# Patient Record
Sex: Male | Born: 1935 | Race: Black or African American | Hispanic: No | Marital: Married | Smoking: Former smoker
Health system: Southern US, Community
[De-identification: ages and names within clinical notes are randomized; demographics above are authoritative.]

## PROBLEM LIST (undated history)

## (undated) DIAGNOSIS — F0390 Unspecified dementia without behavioral disturbance: Secondary | ICD-10-CM

## (undated) DIAGNOSIS — G20A1 Parkinson's disease without dyskinesia, without mention of fluctuations: Secondary | ICD-10-CM

## (undated) DIAGNOSIS — G20 Parkinson's disease: Secondary | ICD-10-CM

## (undated) HISTORY — PX: JOINT REPLACEMENT: SHX530

---

## 2004-03-18 ENCOUNTER — Ambulatory Visit: Payer: Self-pay | Admitting: Unknown Physician Specialty

## 2006-06-20 ENCOUNTER — Ambulatory Visit: Payer: Self-pay | Admitting: General Practice

## 2006-07-02 ENCOUNTER — Other Ambulatory Visit: Payer: Self-pay

## 2006-07-02 ENCOUNTER — Emergency Department: Payer: Self-pay

## 2006-07-06 ENCOUNTER — Inpatient Hospital Stay: Payer: Self-pay | Admitting: General Practice

## 2006-07-11 ENCOUNTER — Encounter: Payer: Self-pay | Admitting: Internal Medicine

## 2006-08-04 ENCOUNTER — Encounter: Payer: Self-pay | Admitting: Internal Medicine

## 2008-10-10 ENCOUNTER — Ambulatory Visit: Payer: Self-pay | Admitting: Unknown Physician Specialty

## 2009-07-24 ENCOUNTER — Ambulatory Visit: Payer: Self-pay | Admitting: Urology

## 2009-07-26 ENCOUNTER — Ambulatory Visit: Payer: Self-pay | Admitting: Neurology

## 2009-11-03 ENCOUNTER — Ambulatory Visit: Payer: Self-pay | Admitting: Oncology

## 2009-11-19 ENCOUNTER — Ambulatory Visit: Payer: Self-pay | Admitting: Oncology

## 2009-12-04 ENCOUNTER — Ambulatory Visit: Payer: Self-pay | Admitting: Oncology

## 2010-01-03 ENCOUNTER — Ambulatory Visit: Payer: Self-pay | Admitting: Oncology

## 2011-02-20 ENCOUNTER — Inpatient Hospital Stay: Payer: Self-pay | Admitting: Internal Medicine

## 2011-02-21 DIAGNOSIS — I369 Nonrheumatic tricuspid valve disorder, unspecified: Secondary | ICD-10-CM

## 2011-02-24 ENCOUNTER — Encounter: Payer: Self-pay | Admitting: Internal Medicine

## 2011-03-06 ENCOUNTER — Encounter: Payer: Self-pay | Admitting: Internal Medicine

## 2011-07-06 ENCOUNTER — Observation Stay: Payer: Self-pay | Admitting: Internal Medicine

## 2011-07-06 LAB — CK TOTAL AND CKMB (NOT AT ARMC)
CK, Total: 204 U/L (ref 35–232)
CK-MB: 2.6 ng/mL (ref 0.5–3.6)

## 2011-07-06 LAB — CBC
HCT: 43.4 % (ref 40.0–52.0)
MCH: 29.8 pg (ref 26.0–34.0)
MCHC: 33.2 g/dL (ref 32.0–36.0)
Platelet: 121 10*3/uL — ABNORMAL LOW (ref 150–440)
RDW: 15.5 % — ABNORMAL HIGH (ref 11.5–14.5)
WBC: 4.5 10*3/uL (ref 3.8–10.6)

## 2011-07-06 LAB — TROPONIN I
Troponin-I: 0.02 ng/mL
Troponin-I: 0.04 ng/mL
Troponin-I: 0.04 ng/mL

## 2011-07-06 LAB — COMPREHENSIVE METABOLIC PANEL
Anion Gap: 9 (ref 7–16)
Calcium, Total: 8.9 mg/dL (ref 8.5–10.1)
Chloride: 107 mmol/L (ref 98–107)
Co2: 26 mmol/L (ref 21–32)
Creatinine: 0.73 mg/dL (ref 0.60–1.30)
Glucose: 92 mg/dL (ref 65–99)
SGOT(AST): 32 U/L (ref 15–37)
SGPT (ALT): 12 U/L

## 2011-07-06 LAB — CK-MB: CK-MB: 2.2 ng/mL (ref 0.5–3.6)

## 2011-07-07 LAB — CBC WITH DIFFERENTIAL/PLATELET
Basophil #: 0 10*3/uL (ref 0.0–0.1)
Basophil %: 0.4 %
Eosinophil #: 0.2 10*3/uL (ref 0.0–0.7)
Eosinophil %: 4.1 %
HCT: 38.2 % — ABNORMAL LOW (ref 40.0–52.0)
Lymphocyte #: 1.1 10*3/uL (ref 1.0–3.6)
Lymphocyte %: 23.1 %
MCH: 29.8 pg (ref 26.0–34.0)
MCV: 89 fL (ref 80–100)
Monocyte #: 0.5 10*3/uL (ref 0.0–0.7)
Monocyte %: 10.1 %
Neutrophil #: 3.1 10*3/uL (ref 1.4–6.5)
RDW: 15.2 % — ABNORMAL HIGH (ref 11.5–14.5)
WBC: 4.9 10*3/uL (ref 3.8–10.6)

## 2011-07-07 LAB — TROPONIN I: Troponin-I: 0.04 ng/mL

## 2011-07-07 LAB — CK-MB: CK-MB: 1.8 ng/mL (ref 0.5–3.6)

## 2011-07-07 LAB — BASIC METABOLIC PANEL
Anion Gap: 8 (ref 7–16)
Chloride: 106 mmol/L (ref 98–107)
Co2: 27 mmol/L (ref 21–32)
Creatinine: 0.85 mg/dL (ref 0.60–1.30)
EGFR (African American): >60
EGFR (Non-African Amer.): >60
Glucose: 90 mg/dL (ref 65–99)
Osmolality: 281 (ref 275–301)
Potassium: 3.7 mmol/L (ref 3.5–5.1)
Sodium: 141 mmol/L (ref 136–145)

## 2013-10-26 ENCOUNTER — Observation Stay: Payer: Self-pay | Admitting: Internal Medicine

## 2013-10-26 LAB — CBC WITH DIFFERENTIAL/PLATELET
BASOS ABS: 0.1 10*3/uL (ref 0.0–0.1)
Basophil %: 1.2 %
EOS ABS: 0.1 10*3/uL (ref 0.0–0.7)
Eosinophil %: 2.8 %
HCT: 42.4 % (ref 40.0–52.0)
HGB: 13.8 g/dL (ref 13.0–18.0)
Lymphocyte #: 1.4 10*3/uL (ref 1.0–3.6)
Lymphocyte %: 30 %
MCH: 29.1 pg (ref 26.0–34.0)
MCHC: 32.5 g/dL (ref 32.0–36.0)
MCV: 90 fL (ref 80–100)
Monocyte #: 0.5 x10 3/mm (ref 0.2–1.0)
Monocyte %: 11.2 %
NEUTROS ABS: 2.5 10*3/uL (ref 1.4–6.5)
Neutrophil %: 54.8 %
PLATELETS: 104 10*3/uL — AB (ref 150–440)
RBC: 4.74 10*6/uL (ref 4.40–5.90)
RDW: 15.4 % — ABNORMAL HIGH (ref 11.5–14.5)
WBC: 4.6 10*3/uL (ref 3.8–10.6)

## 2013-10-26 LAB — URINALYSIS, COMPLETE
BLOOD: NEGATIVE
Bilirubin,UR: NEGATIVE
Glucose,UR: NEGATIVE mg/dL (ref 0–75)
KETONE: NEGATIVE
LEUKOCYTE ESTERASE: NEGATIVE
Nitrite: NEGATIVE
PH: 6 (ref 4.5–8.0)
Protein: NEGATIVE
RBC,UR: <1 /HPF (ref 0–5)
Specific Gravity: 1.015 (ref 1.003–1.030)
Squamous Epithelial: <1

## 2013-10-26 LAB — COMPREHENSIVE METABOLIC PANEL
Albumin: 3.5 g/dL (ref 3.4–5.0)
Alkaline Phosphatase: 31 U/L — ABNORMAL LOW
Anion Gap: 11 (ref 7–16)
BUN: 18 mg/dL (ref 7–18)
Bilirubin,Total: 0.8 mg/dL (ref 0.2–1.0)
CHLORIDE: 108 mmol/L — AB (ref 98–107)
Calcium, Total: 9 mg/dL (ref 8.5–10.1)
Co2: 26 mmol/L (ref 21–32)
Creatinine: 1.21 mg/dL (ref 0.60–1.30)
EGFR (African American): >60
EGFR (Non-African Amer.): 56 — ABNORMAL LOW
GLUCOSE: 87 mg/dL (ref 65–99)
Osmolality: 290 (ref 275–301)
Potassium: 3.7 mmol/L (ref 3.5–5.1)
SGOT(AST): 17 U/L (ref 15–37)
SGPT (ALT): 16 U/L
SODIUM: 145 mmol/L (ref 136–145)
TOTAL PROTEIN: 6.9 g/dL (ref 6.4–8.2)

## 2013-10-26 LAB — TSH: Thyroid Stimulating Horm: 1.68 u[IU]/mL

## 2013-10-26 LAB — TROPONIN I
TROPONIN-I: 0.03 ng/mL
TROPONIN-I: 0.04 ng/mL

## 2013-10-27 LAB — BASIC METABOLIC PANEL
Anion Gap: 8 (ref 7–16)
BUN: 17 mg/dL (ref 7–18)
CALCIUM: 8.5 mg/dL (ref 8.5–10.1)
Chloride: 109 mmol/L — ABNORMAL HIGH (ref 98–107)
Co2: 26 mmol/L (ref 21–32)
Creatinine: 1.17 mg/dL (ref 0.60–1.30)
GFR CALC NON AF AMER: 59 — AB
Glucose: 72 mg/dL (ref 65–99)
OSMOLALITY: 285 (ref 275–301)
Potassium: 3.4 mmol/L — ABNORMAL LOW (ref 3.5–5.1)
Sodium: 143 mmol/L (ref 136–145)

## 2013-10-27 LAB — CBC WITH DIFFERENTIAL/PLATELET
BASOS ABS: 0.1 10*3/uL (ref 0.0–0.1)
Basophil %: 1.1 %
EOS PCT: 3.2 %
Eosinophil #: 0.2 10*3/uL (ref 0.0–0.7)
HCT: 40.1 % (ref 40.0–52.0)
HGB: 13.3 g/dL (ref 13.0–18.0)
LYMPHS PCT: 27.7 %
Lymphocyte #: 1.5 10*3/uL (ref 1.0–3.6)
MCH: 29.5 pg (ref 26.0–34.0)
MCHC: 33.1 g/dL (ref 32.0–36.0)
MCV: 89 fL (ref 80–100)
Monocyte #: 0.6 x10 3/mm (ref 0.2–1.0)
Monocyte %: 11.4 %
Neutrophil #: 3 10*3/uL (ref 1.4–6.5)
Neutrophil %: 56.6 %
PLATELETS: 90 10*3/uL — AB (ref 150–440)
RBC: 4.48 10*6/uL (ref 4.40–5.90)
RDW: 16 % — ABNORMAL HIGH (ref 11.5–14.5)
WBC: 5.3 10*3/uL (ref 3.8–10.6)

## 2013-10-27 LAB — CK-MB
CK-MB: 2.8 ng/mL (ref 0.5–3.6)
CK-MB: 2.3 ng/mL (ref 0.5–3.6)
CK-MB: 2.5 ng/mL (ref 0.5–3.6)

## 2013-10-27 LAB — TROPONIN I: TROPONIN-I: 0.04 ng/mL

## 2013-10-28 LAB — BASIC METABOLIC PANEL
ANION GAP: 8 (ref 7–16)
BUN: 12 mg/dL (ref 7–18)
Calcium, Total: 8.1 mg/dL — ABNORMAL LOW (ref 8.5–10.1)
Chloride: 109 mmol/L — ABNORMAL HIGH (ref 98–107)
Co2: 24 mmol/L (ref 21–32)
Creatinine: 1.04 mg/dL (ref 0.60–1.30)
EGFR (African American): >60
EGFR (Non-African Amer.): >60
Glucose: 98 mg/dL (ref 65–99)
OSMOLALITY: 281 (ref 275–301)
Potassium: 3.6 mmol/L (ref 3.5–5.1)
Sodium: 141 mmol/L (ref 136–145)

## 2013-10-28 LAB — URINE CULTURE

## 2013-10-31 LAB — CULTURE, BLOOD (SINGLE)

## 2013-11-01 LAB — CULTURE, BLOOD (SINGLE)

## 2014-07-02 LAB — URINALYSIS, COMPLETE
BACTERIA: NONE SEEN
BLOOD: NEGATIVE
Bilirubin,UR: NEGATIVE
GLUCOSE, UR: NEGATIVE mg/dL (ref 0–75)
Hyaline Cast: 16
Ketone: NEGATIVE
LEUKOCYTE ESTERASE: NEGATIVE
NITRITE: NEGATIVE
PROTEIN: NEGATIVE
Ph: 6 (ref 4.5–8.0)
RBC,UR: NONE SEEN /HPF (ref 0–5)
Specific Gravity: 1.01 (ref 1.003–1.030)
Squamous Epithelial: NONE SEEN
WBC UR: NONE SEEN /HPF (ref 0–5)

## 2014-07-02 LAB — BASIC METABOLIC PANEL
Anion Gap: 6 — ABNORMAL LOW (ref 7–16)
BUN: 18 mg/dL
CREATININE: 1.22 mg/dL
Calcium, Total: 9 mg/dL
Chloride: 106 mmol/L
Co2: 29 mmol/L
GFR CALC NON AF AMER: 56 — AB
Glucose: 130 mg/dL — ABNORMAL HIGH
POTASSIUM: 4 mmol/L
Sodium: 141 mmol/L

## 2014-07-02 LAB — CBC WITH DIFFERENTIAL/PLATELET
BASOS ABS: 0 10*3/uL (ref 0.0–0.1)
Basophil %: 0.7 %
EOS ABS: 0.2 10*3/uL (ref 0.0–0.7)
Eosinophil %: 4.4 %
HCT: 42 % (ref 40.0–52.0)
HGB: 13.8 g/dL (ref 13.0–18.0)
Lymphocyte #: 1.3 10*3/uL (ref 1.0–3.6)
Lymphocyte %: 34.2 %
MCH: 29.5 pg (ref 26.0–34.0)
MCHC: 32.8 g/dL (ref 32.0–36.0)
MCV: 90 fL (ref 80–100)
MONOS PCT: 10.2 %
Monocyte #: 0.4 x10 3/mm (ref 0.2–1.0)
NEUTROS PCT: 50.5 %
Neutrophil #: 1.9 10*3/uL (ref 1.4–6.5)
PLATELETS: 103 10*3/uL — AB (ref 150–440)
RBC: 4.66 10*6/uL (ref 4.40–5.90)
RDW: 16.3 % — AB (ref 11.5–14.5)
WBC: 3.7 10*3/uL — AB (ref 3.8–10.6)

## 2014-07-02 LAB — TROPONIN I

## 2014-07-03 LAB — CBC WITH DIFFERENTIAL/PLATELET
Basophil #: 0 10*3/uL (ref 0.0–0.1)
Basophil %: 0.8 %
EOS PCT: 4.8 %
Eosinophil #: 0.2 10*3/uL (ref 0.0–0.7)
HCT: 39.1 % — ABNORMAL LOW (ref 40.0–52.0)
HGB: 12.8 g/dL — ABNORMAL LOW (ref 13.0–18.0)
LYMPHS ABS: 1.2 10*3/uL (ref 1.0–3.6)
Lymphocyte %: 32 %
MCH: 29.2 pg (ref 26.0–34.0)
MCHC: 32.7 g/dL (ref 32.0–36.0)
MCV: 89 fL (ref 80–100)
MONO ABS: 0.5 x10 3/mm (ref 0.2–1.0)
Monocyte %: 12.6 %
Neutrophil #: 1.9 10*3/uL (ref 1.4–6.5)
Neutrophil %: 49.8 %
PLATELETS: 90 10*3/uL — AB (ref 150–440)
RBC: 4.37 10*6/uL — AB (ref 4.40–5.90)
RDW: 16.3 % — AB (ref 11.5–14.5)
WBC: 3.8 10*3/uL (ref 3.8–10.6)

## 2014-07-03 LAB — BASIC METABOLIC PANEL
Anion Gap: 5 — ABNORMAL LOW (ref 7–16)
BUN: 17 mg/dL
CALCIUM: 8.7 mg/dL — AB
Chloride: 110 mmol/L
Co2: 27 mmol/L
Creatinine: 1.07 mg/dL
EGFR (African American): >60
Glucose: 106 mg/dL — ABNORMAL HIGH
POTASSIUM: 3.9 mmol/L
SODIUM: 142 mmol/L

## 2014-07-03 LAB — TROPONIN I: Troponin-I: <0.03 ng/mL

## 2014-07-04 ENCOUNTER — Inpatient Hospital Stay
Admit: 2014-07-04 | Disposition: A | Discharge status: Skilled Nursing Facility | Payer: Self-pay | Attending: Internal Medicine | Admitting: Internal Medicine

## 2014-07-04 ENCOUNTER — Ambulatory Visit: Admit: 2014-07-04 | Disposition: A | Discharge status: Home / Self Care | Payer: Self-pay | Admitting: Neurology

## 2014-07-06 LAB — CLOSTRIDIUM DIFFICILE(ARMC)

## 2014-07-07 LAB — CREATININE, SERUM
CREATININE: 0.9 mg/dL
EGFR (African American): >60
EGFR (Non-African Amer.): >60

## 2014-07-08 DIAGNOSIS — R55 Syncope and collapse: Secondary | ICD-10-CM | POA: Diagnosis not present

## 2014-07-08 DIAGNOSIS — I2510 Atherosclerotic heart disease of native coronary artery without angina pectoris: Secondary | ICD-10-CM

## 2014-07-08 DIAGNOSIS — N400 Enlarged prostate without lower urinary tract symptoms: Secondary | ICD-10-CM

## 2014-07-08 DIAGNOSIS — E785 Hyperlipidemia, unspecified: Secondary | ICD-10-CM

## 2014-07-08 DIAGNOSIS — G3183 Dementia with Lewy bodies: Secondary | ICD-10-CM | POA: Diagnosis not present

## 2014-07-08 DIAGNOSIS — G20 Parkinson's disease: Secondary | ICD-10-CM

## 2014-07-23 DIAGNOSIS — R441 Visual hallucinations: Secondary | ICD-10-CM | POA: Diagnosis not present

## 2014-07-27 NOTE — H&P (Signed)
PATIENT NAME:  Levi Jordan, [name]:  [id number] DATE OF BIRTH:  11-Dec-1931  DATE OF ADMISSION:  10/26/2013  PRIMARY CARE PHYSICIAN:   REFERRING PHYSICIAN: Dr. Dorothea GlassmanPaul Malinda.   CHIEF COMPLAINT: Generalized weakness.   HISTORY OF PRESENT ILLNESS: Mr. Levi Jordan is an 81 year old with a history of hypertension, hyperlipidemia, Parkinson disease, per family who is well functional at baseline. The patient usually wakes up in the morning by 6:30, fixes his breakfast, walks around the house. For the last 3 days, the patient has been experiencing severe generalized weakness, sleeping most of the day with a decreased p.o. intake. Family states the patient slept most of the day the last 2 days. Today, when the patient's grandson tried to make him stand up in the morning, the patient fell backwards. This is a significant change from the baseline. Denies having any cough. Denies having any nausea, vomiting or diarrhea. Denies having any dysuria. Family did not notice having any fever. Denies having any sick contacts. Workup in the Emergency Department, no obvious source of any infection is noted. The patient is afebrile, has a normal white blood cell count. Cardiac enzymes are well within normal limits. Family stated that the patient has been having frequent urination; however, the patient's urinalysis is negative for any infection. The patient underwent chest x-ray, within normal limits. CT head without contrast, no acute abnormalities were noted. CT abdomen and pelvis showed large inguinal hernia containing proximal part of the sigmoid colon; otherwise, no signs of any obstruction. The patient also has a bilateral hydrocele, varicocele. The patient is unable to provide much history. The history is mainly obtained from the family members.   PAST MEDICAL HISTORY: 1.  Hypertension.  2.  Chronic kidney disease, stage II. 3.  Hyperuricemia.   4.  History of herpes simplex, type 2.  5.  H. pylori gastritis.  6.   Obesity.   7.  Glaucoma.  8.  Parkinson disease.  9.  Sigmoid diverticulosis.  10.  Obesity.   PAST SURGICAL HISTORY: 1.  Right inguinal hernia repair in teenage years. 2.  Colon polypectomy.  3.  Right total knee arthroplasty.  4.  Aspiration of the hydrocele.   ALLERGIES: No known drug allergies.   HOME MEDICATIONS: 1.  Travatan 1 drop to affected eye.  2.  Sinemet 1 tablet 3 times a day.  3.  Rapaflo 8 mg once a day.  4.  Pramipexole 0.25 mg 2 tablets 3 times a day.  5.  Toprol-XL 1 tablet once a day.  6.  Lisinopril 10 mg once a day.  7.  Aspirin 81 mg daily.   SOCIAL HISTORY: Remote history of heavy smoking, quit 20 years back. Drank alcohol remotely. Denies using any illicit drugs. Retired. Lives with his family members.   FAMILY HISTORY: Father died at the age of 79 from stroke. Had history of high blood pressure. Brother died from colon cancer.   REVIEW OF SYSTEMS:  GENERAL:  Positive for generalized weakness.  EYES: Denies having any change in vision.  ENT: No change in hearing.  CARDIOVASCULAR:  No chest pain, palpations.  RESPIRATORY: No cough, shortness of breath.  GASTROINTESTINAL: No nausea, vomiting, abdominal pain.  GENITOURINARY:  No  frequent urination.  HEMATOLOGIC: No easy bruising or bleeding.  SKIN: No rash or lesions.  MUSCULOSKELETAL:  Has history of arthritis. NEUROLOGIC:  The patient has a history of dementia and Parkinson disease.   PHYSICAL EXAMINATION: GENERAL: This is a well-built, well-nourished, age-appropriate male lying down in  the bed, not in distress.  VITAL SIGNS: Temperature 97.5, pulse 57, blood pressure 143/70, respiratory rate of 14. Oxygen saturation is 98% on room air.  HEENT: Head normocephalic, atraumatic. There is no scleral icterus. Conjunctivae normal. Pupils equal and reactive. Mucous membranes with mild dryness. No pharyngeal erythema.  NECK: Supple. No lymphadenopathy. No JVD. No carotid bruit.  CHEST: Has no focal  tenderness.  LUNGS: Bilaterally clear to auscultation.  HEART: S1, S2 regular. No murmurs are heard.  ABDOMEN: Bowel sounds present. Soft, nontender, nondistended. No hepatosplenomegaly.  EXTREMITIES: No pedal edema. Pulses 2+.  SKIN: No rash or lesions.  MUSCULOSKELETAL: Good range of motion in all the extremities.  NEUROLOGIC: The patient is alert, oriented to place, person. Could not tell the time. No apparent cranial nerve abnormalities. Motor 5/5 in upper and lower extremities.   LABORATORY DATA: CBC and CMP are completely within normal limits. TSH 1.68. Chest x-ray, PA and lateral, no acute cardiopulmonary disease. UA negative for nitrites and leukocyte esterase. CT HEAD WITHOUT CONTRAST: No acute intracranial abnormalities.  CT ABDOMEN AND PELVIS:  Large inguinal hernia containing proximal sigmoid colon. No obstruction. Bilateral hydroceles.   ULTRASOUND OF THE TESTICLES:  No evidence of testicular torsion, bilateral hydroceles noted.  Troponin 0.04.   ASSESSMENT AND PLAN: Mr. Levi Jordan, an 81 year old, comes to the Emergency Department with severe generalized weakness of acute onset.  1.  Generalized weakness. No obvious signs of any infection is noted. We will hold all sedating medications as the patient has been sleeping most of the day. Continue with IV fluids. We will involve physical therapy. We will also continue to follow up if patient has any signs of underlying infection, if the patient is developing any possibly viral infection. The patient's initial set of cardiac enzymes are negative; however, we will also check 2 other times, which seems to be unlikely.  2.  Hypertension, currently well-controlled. Continue with the home medications.  3.  Parkinson disease. Continue with Sinemet.  4.  Debility. We will involve physical therapy, occupational therapy.  5.  Keep the patient on deep vein thrombosis prophylaxis with Lovenox.   TIME SPENT: 50 minutes.      ____________________________ Susa Griffins, MD pv:dmm D: 10/26/2013 21:30:00 ET T: 10/26/2013 22:00:03 ET JOB#: [id number]  cc: Susa Griffins, MD, <Dictator>   Clerance Lav Berkley Cronkright MD ELECTRONICALLY SIGNED 11/08/2013 21:03

## 2014-07-27 NOTE — Discharge Summary (Signed)
PATIENT NAME:  Levi Jordan, Levi Jordan MR#:  [id number] DATE OF BIRTH:  17-Jan-1932  DATE OF ADMISSION:  10/26/2013 DATE OF DISCHARGE:  10/28/2013  ADMISSION DIAGNOSES:   1. Weakness. 2. Parkinson disease.  DISCHARGE DIAGNOSES:  1. Bacteremia with coag-negative staph.  2. Generalized weakness.  3. History of Parkinson disease.  4. Hypertension.   CONSULTATIONS: None.   LABORATORY DATA: Sodium 141, potassium 3.6, chloride 109, bicarbonate 24, BUN 12, creatinine 1.04, glucose is 98.   BLOOD CULTURES: Initial blood cultures showed coag-negative staph. Repeat blood cultures were negative growth.   HOSPITAL COURSE: This is an 81 year old male with a history of Parkinson disease, who presented on July 24th with generalized weakness. For further details, please refer to the H and P.  1. Bacteremia. The patient presented with generalized weakness, found to have positive blood cultures initially on admission. These actually seemed to be a contaminate. One out of 2 blood cultures were growing out coag-negative staphylococcus. Before antibiotics were started I repeated blood cultures which were negative to date. I did call the laboratory to confirm coag-negative staphylococcus. He was empirically placed on vancomycin and his symptoms stopped. 2. General weakness. PT recommended skilled nursing facility. However, the patient wants to go home with home health care. He has 24-hour supervision with family.  3. History of Parkinson disease. The patient is to continue on outpatient medications.  4. Hypertension. The patient was continued on metoprolol.   DISCHARGE MEDICATIONS:  1. Aspirin 81 mg daily.  2. Metoprolol 25 mg daily.  3. Rapaflo 8 mg daily.  4. Pramipexole 0.5 mg t.i.d.  5. CAridopa/levodopa 50/200 half a tablet t.i.d.  6. Travatan 0.04% ophthalmic solution 2 drops at bedtime.  7. Torsemide 5 mg daily.   Discharge with home health, physical therapy with nurse.   DISCHARGE DIET: Low sodium.    DISCHARGE DIETARY SUPPLEMENT: Ensure daily.  DISCHARGE FOLLOWUP: The patient will follow up with Dr. Carlus PavlovKristen Moffitt in 1 week.   The patient was stable for discharge. The patient will be discharged with home health.   TIME SPENT: 40 minutes.     ____________________________ Janyth ContesSital P. Juliene PinaMody, MD spm:lt D: 10/28/2013 12:14:26 ET T: 10/28/2013 22:08:01 ET JOB#: [id number]  cc: Jovahn Breit P. Juliene PinaMody, MD, <Dictator> Gwendalyn EgeKristen S. Suzie PortelaMoffitt, MD Janyth ContesSITAL P Shatera Rennert MD ELECTRONICALLY SIGNED 10/29/2013 13:03

## 2014-07-28 NOTE — Consult Note (Signed)
Brief Consult Note: Diagnosis: CP, neg troponin, nondiagnostic ECG.   Patient was seen by consultant.   Consult note dictated.   Comments: REC  Agree wtih current therapy, defer full dose anticoagulation, proceed with Lexiscan-sest in am.  Electronic Signatures: Marcina MillardParaschos, Jillann Charette (MD)  (Signed 02-Apr-13 19:24)  Authored: Brief Consult Note   Last Updated: 02-Apr-13 19:24 by Marcina MillardParaschos, Lesley Galentine (MD)

## 2014-07-28 NOTE — Discharge Summary (Signed)
PATIENT NAME:  Levi Jordan, Levi Jordan MR#:  [id number] DATE OF BIRTH:  [birth year]  DATE OF ADMISSION:  07/06/2011 DATE OF DISCHARGE:  07/07/2011  PRIMARY CARE PHYSICIAN: Einar CrowMarshall Anderson, MD   DISCHARGE DIAGNOSES:  1. Chest pain, likely noncardiac, with negative serial cardiac enzymes and negative Myoview. Could be muscular versus gastroesophageal reflux disease.  2. Sweating on and off for the last three months of unknown etiology. Recommend outpatient follow-up with his primary care physician.  3. Mild lower extremity swelling. No congestive heart failure. He does not have any dyspnea on exertion.  4. Malignant hypertension, now resolved. Could be due to pain. Advanced blood pressure medication.   SECONDARY DIAGNOSES:  1. History of bladder outlet obstruction.  2. Hypertension.  3. Chronic kidney disease, stage II.  4. Hyperuricemia.  5. Herpes simplex virus, type 2.  6. Helicobacter pylori gastritis.  7. Obesity.  8. Hypertension.  9. Glaucoma.  10. History of colon cancer.  11. Cecal adenoma.  12. Sigmoid diverticulosis.  13. Parkinson's disease.   CONSULTATIONS:  Cardiology, Dr. Darrold JunkerParaschos.   LABORATORY, DIAGNOSTIC AND RADIOLOGICAL DATA:   Myoview on April 3rd was negative.   Chest x-ray on April 2nd showed no acute cardiopulmonary disease.  Bilateral lower extremity Dopplers on April 2nd were negative for any deep vein thrombosis.   HISTORY AND SHORT HOSPITAL COURSE: The patient is a 81 year old male with the above-mentioned medical problems who was admitted for chest pain. Please see Dr. Arlys JohnVaickute's dictated History and Physical for further details. Cardiology consult was obtained with Dr. Darrold JunkerParaschos, who recommended Myoview, which was obtained and was negative. He had negative four sets of cardiac enzymes and was ruled out for myocardial infarction. After a discussion with Cardiology, the patient was discharged home as his chest pain was thought to be noncardiac, possible etiologies  were muscular versus possible gastroesophageal reflux disease. The patient also had intermittent on and off sweating for the last three months for which he may need outpatient work-up with his primary care physician. The patient's blood pressure on admission was elevated, which was thought to be secondary to pain and was slowly improving with better blood pressure medication control.   PERTINENT DISCHARGE PHYSICAL EXAMINATION:  VITAL SIGNS: On the date of discharge, his temperature was 97.7, heart rate 68 per minute, respirations 18 per minute, blood pressure 143/75 mmHg. He was saturating 98% on room air. CARDIOVASCULAR: S1, S2 normal. No murmurs, rubs, or gallop. LUNGS: Clear to auscultation bilaterally. No wheezing, rales, rhonchi, or crepitation. ABDOMEN: Soft, benign. NEUROLOGICAL: Nonfocal examination. All other physical examination remained at the baseline.   DISCHARGE MEDICATIONS:  1. Aspirin 81 mg p.o. daily.  2. Sinemet 25/100 mg 1 tablet p.o. t.i.d.  3. Betimol 0.5% ophthalmic eyedrops b.i.d.  4. Travatan Z 0.004%, 1 drop to each eye at bedtime.  5. Pramipexole 0.25 mg, 2 tablets p.o. t.i.d.  6. Donepezil 10 mg p.o. at bedtime.  7. Rapaflo 8 mg p.o. daily.  8. Metoprolol 50 mg p.o. daily.  9. Lisinopril 10 mg p.o. daily.   DISCHARGE DIET: Low sodium.   DISCHARGE ACTIVITY: As tolerated.     DISCHARGE INSTRUCTIONS AND FOLLOWUP:    1. The patient was instructed to follow up with his new primary care physician, Dr. Einar CrowMarshall Anderson, on April 17th as scheduled.  2. He will need followup with Dr. Cristopher PeruHemang Tyna [facility] on April 17th as scheduled.  3. [facility] in the next 2 to 3  weeks.  4. He will require followup with his outpatient urologist as scheduled on April 15th.   TOTAL TIME DISCHARGING THIS PATIENT: 45 minutes.  ____________________________ Ellamae Sia. Sherryll Burger, MD vss:cbb D: 07/07/2011 22:35:12  ET T: 07/08/2011 11:29:42 ET JOB#: [id number]  cc: Papa Piercefield S. Sherryll Burger, MD, <Dictator> Marya Amsler. Dareen Piano, MD Durward Mallard Marguerite Olea, MD Hemang K. Sherryll Burger, MD Marcina Millard, MD Ellamae [facility] MD ELECTRONICALLY SIGNED 07/08/2011 21:52

## 2014-07-28 NOTE — H&P (Signed)
PATIENT NAME:  Levi Jordan, Levi Jordan MR#:  [id number] DATE OF BIRTH:  1931/07/08  DATE OF ADMISSION:  07/06/2011  PRIMARY CARE PHYSICIAN: Wonda Cheng, MD  HISTORY OF PRESENT ILLNESS: The patient is a 81 year old African American male with past medical history significant for history of hypertension and coronary artery disease who presented to the hospital with complaints of chest pressure. According to the patient, he was doing well up until today, on the day of admission, when he suddenly became very sweaty and started having midsternal chest tightness. Pain was described as intermittent, 10/10 by intensity, with no radiation or discomfort, but accompanied significant sweating. In the past, he was having some sweating over the past 3 or 4 years. It would come intermittently, every 3 or 4 months, but it was not accompanied by chest tightness. Today he had chest tightness as well. The patient decided to come to the emergency room for further evaluation. In the emergency room, he was noted to have an abnormal EKG and hospitalist services were contacted for admission.   PAST MEDICAL HISTORY:  1. History of bladder outlet obstruction.  2. History of hypertension.  3. Chronic kidney disease, stage II. 4. Hyperuricemia. 5. History of herpes simplex virus, type II. 6. H. pylori gastritis. 7. Obesity. 8. Hypertension. 9. Glaucoma. 10. Family history of colon cancer in brother. 11. Cecal adenoma. 12. Sigmoid diverticulosis. 13. Parkinson's disease.   PAST SURGICAL HISTORY:  1. Right inguinal hernia repair, in teenage years. 2. History of colon polypectomy in 2000. 3. History of right total knee arthroplasty in 2008. 4. Aspiration of right hydrocele in 2010.   MEDICATIONS:  1. Travatan Z drops 0.004% one drop to both eyes daily at bedtime.  2. Betimol drops 0.5% one drop twice daily.  3. Amlodipine 10 mg daily.  4. Aspirin 81 mg p.o. daily.  5. Lopressor, it is unclear how much he is taking, but  according to prior discharge summary he was discharged on 50 mg daily; however, according to Dr. Marguerite Olea, he is still taking 25 mg p.o. daily.  6. Sinemet 25/100 mg three times daily. 7. Pramipexole 0.5 mg three times daily.  8. Aricept 5 mg p.o. daily.  9. Vitamin B12 1000 mcg p.o. daily. 10. He is also taking some medication for bladder outlet obstruction. He does not remember what. 11. According to Dr. Margaretmary Eddy dictation in January 2013, the patient was also on lisinopril 20 mg p.o. daily. It is unclear if he is still taking lisinopril.  ALLERGIES: No known drug allergies.   FAMILY HISTORY: The patient's father died at the age of 53 of stroke, as well as high blood pressure. The patient's mother's history is relatively benign. The patient's brother is deceased with colon cancer.   SOCIAL HISTORY: The patient used to smoke two to three packs a day until age of 63, which would be somewhere around 20 years. He used to drink but states that he stopped drinking at the age of 79. He does not take any drugs. He is retired.   REVIEW OF SYSTEMS: Positive for weakness with sweating episodes, pain in his chest, blurring of vision, cataract removal bilaterally as well as glaucoma for which he uses reading glasses, sinus congestion intermittently, ankle edema, chest pressure, and increased urination for he has been taking medications given by Dr. Evelene Croon from urology. He does not remember what kind. CONSTITUTIONAL: Otherwise, he denies any fevers, chills, fatigue, weight loss or gain.  EYES: In regards to eyes, denies any double vision or  inflammation. ENT: Denies any tinnitus, allergies, epistaxis, sinus pain, dentures, or difficulty swallowing. RESPIRATORY: Denies any cough, wheezing, asthma, or COPD. CARDIOVASCULAR: Denies orthopnea, arrhythmias, palpitations, or syncope. GASTROINTESTINAL: Denies any nausea, vomiting, diarrhea, or constipation. GENITOURINARY: Denies dysuria or incontinence. ENDOCRINOLOGY: Denies  any polydipsia, nocturia, thyroid problems, heat or cold intolerance, or thirst. HEMATOLOGIC: Denies anemia, easy bruising, bleeding, or swollen glands. SKIN: Denies any acne, rashes, lesions, or change in moles. MUSCULOSKELETAL: Denies arthritis, cramps, swelling, or gout. NEUROLOGIC: Denies numbness, epilepsy, or tremor. PSYCH: Denies anxiety, insomnia, or depression.   PHYSICAL EXAMINATION:   VITAL SIGNS: On arrival to the hospital, the patient's temperature was 97.4, pulse 55, respiration rate 18, blood pressure 189/90, and saturation 100% on room air.  GENERAL: This is a well-developed, well-nourished African American male in no significant distress sitting on the stretcher. He is somewhat stiff and mimic less with no significant facial mimicry.   HEENT: His pupils are equal and reactive to light. Extraocular movements are intact. No icterus or conjunctivitis. He has normal hearing. No pharyngeal erythema. Mucosa is moist.  NECK: No masses, supple and nontender. Thyroid is not enlarged. No adenopathy. No JVD or carotid bruits bilaterally. Full range of motion.   LUNGS: Clear to auscultation in all fields, although diminished breath sounds. No rales, rhonchi, wheezing, labored inspirations, increased effort, dullness to percussion, or overt respiratory distress.   HEART: S1 and S2 appreciated. No murmurs, gallops, or rubs are noted. Rhythm was regular, distant. PMI not lateralized. Chest is nontender to palpation.  EXTREMITIES:  1+ pedal pulses and 1 to 2+ lower extremity, as well as induration, especially on the left, less induration on the right.   ABDOMEN: Soft and nontender. Bowel sounds are present. No hepatosplenomegaly or masses were noted.   RECTAL: Deferred.   MUSCULOSKELETAL: Able to move all extremities. No cyanosis, degenerative joint disease, or kyphosis. Gait was not tested.   SKIN: No rashes, lesions, erythema, or nodularity. The patient does have induration of his right  lower extremity more than left lower extremity. Skin was warm and dry to palpation. Peripheral pulses are good at 2+.   LYMPH: No adenopathy in the cervical region.   NEUROLOGICAL: Cranial nerves grossly intact. Sensory is intact. The patient has some dysarthria because of his Parkinson's disease as well as some rolling pill movements in his hands and significant stiffness/rigidity. He is alert and oriented to time, person, and place. Cooperative. Memory is good.   PSYCHIATRIC: No significant confusion, agitation, or depression noted.   LABS/STUDIES: BMP was within normal limits. B-type natriuretic peptide was slightly elevated at 489. Liver enzymes were unremarkable. The patient's cardiac enzymes, first set, was normal. White blood cell count is normal at 4.5, hemoglobin 14.4, and platelets 121.   EKG showed normal sinus rhythm at 61 beats per minute. RSR prime pattern in V1 suggesting right ventricular conduction delay. Flattening as well as depressions in bilateral leads. No acute ST-T changes were noted. In comparison to other EKG done in November 2012, at that time the patient also had very similar EKG changes, so no change since November 2012.   ASSESSMENT AND PLAN:  1. Chest pain: Admit the patient to the medical floor. Continue him on beta blockers and aspirin. Add also nitroglycerin as well as Lovenox. Check cardiac enzymes x3. Get Myoview stress test in the morning.  2. Malignant hypertension: Continue the patient on Norvasc as well as metoprolol. Return back to lisinopril 20 mg p.o. daily dose. Advance as necessary.  3.  History of Parkinson disease: Continue outpatient medications.   4. History of glaucoma: Continue medications.  5. History of benign prostatic hypertrophy: Start the patient on Flomax. It is unclear if he is using Flomax or other medication for his benign prostatic hypertrophy. However, at least for now, we will be continuing this medication.        6. History of  B12 deficiency: Continue B12.   TIME SPENT: 50 minutes. ____________________________ Katharina Caper, MD rv:slb D: 07/06/2011 13:01:28 ET T: 07/06/2011 15:03:41 ET JOB#: [id number]  cc: Katharina Caper, MD, <Dictator> Durward Mallard. Marguerite Olea, MD Katharina Caper MD ELECTRONICALLY SIGNED 07/07/2011 14:26

## 2014-07-28 NOTE — Consult Note (Signed)
PATIENT NAME:  Levi Jordan, Levi Jordan MR#:  [id number] DATE OF BIRTH:  1932/02/18  DATE OF CONSULTATION:  07/06/2011  REFERRING PHYSICIAN:  Katharina Caperima Vaickute, MD CONSULTING PHYSICIAN:  Marcina MillardAlexander Allani Reber, MD  CHIEF COMPLAINT: Chest pain.   HISTORY OF PRESENT ILLNESS: The patient is a 81 year old gentleman referred for evaluation of chest pain. The patient has a remote history of coronary artery disease. He was apparently in his usual state of health until earlier today when he experienced substernal chest tightness. He presented to the [facility] Emergency Room where EKG was nondiagnostic. The patient was admitted for observation where he has ruled out for myocardial infarction by CPK isoenzymes and troponin.   PAST MEDICAL HISTORY:  1. The patient reports a prior history of coronary artery disease with myocardial infarction over 10 years ago.  2. Hypertension.  3. Parkinson's.  4. Chronic kidney disease.  5. History of bladder outlet obstruction.  6. Obesity.   MEDICATIONS:  1. Aspirin 81 mg daily.  2. Amlodipine 10 mg daily.  3. Lopressor 25 mg daily.  4. Betimol drops 0.5% twice a day. 5. Sinemet 25/100 mg three times daily.  6. Pramipexole 25 mg three times daily. 7. Aricept 5 mg daily.  8. Vitamin B12 1000 mcg daily.   SOCIAL HISTORY: The patient currently lives alone, but his daughters are frequent visitors. The patient quit tobacco abuse 20 years ago.   FAMILY HISTORY: No immediate family history of coronary artery disease or myocardial infarction.   REVIEW OF SYSTEMS: CONSTITUTIONAL: No fever or chills. EYES: No blurry vision. EARS: No hearing loss. RESPIRATORY: The patient denies shortness of breath. CARDIOVASCULAR: The patient does have chest pain as described above, which has resolved. GASTROINTESTINAL: The patient denies nausea, vomiting, diarrhea, or constipation. GU: The patient denies dysuria or hematuria. ENDOCRINE: The patient denies polyuria or polydipsia. MUSCULOSKELETAL: The  patient denies arthralgias or myalgias. NEUROLOGICAL: The patient denies focal muscle weakness or numbness. The patient does have Parkinson's.   PHYSICAL EXAMINATION:   VITAL SIGNS: Blood pressure 178/97, pulse 75, respirations 18, temperature 98.1, and pulse oximetry 98%.   HEENT: Pupils are equal and reactive to light and accommodation.   NECK: Supple without thyromegaly.   LUNGS: Clear.   HEART: Normal jugular venous pressure. Normal point of maximal impulse. Regular rate and rhythm. Normal S1 and S2. No appreciable gallop, murmur, or rub.   ABDOMEN: Soft and nontender. Pulses were intact bilaterally.   MUSCULOSKELETAL: Normal muscle tone.   NEUROLOGIC: The patient is alert and oriented. He had poor insight. The patient does have a resting tremor consistent with Parkinson's.   IMPRESSION: This is a 81 year old gentleman who presents with chest pain and has ruled out for myocardial infarction with negative cardiac biomarkers.      RECOMMENDATIONS:  1. Agree with current therapy.  2. Agree with Lexiscan sestamibi study in the a.m.  3. Further recommendations pending Lexiscan sestamibi results.  ____________________________ Marcina MillardAlexander Jewelene Mairena, MD ap:slb D: 07/06/2011 19:23:12 ET T: 07/07/2011 10:12:08 ET JOB#: [id number]  cc: Marcina MillardAlexander Quincee Gittens, MD, <Dictator> Marcina MillardALEXANDER Labria Wos MD ELECTRONICALLY SIGNED 07/21/2011 8:46

## 2014-08-04 NOTE — H&P (Signed)
PATIENT NAME:  Levi Jordan, Levi Jordan MR#:  [id number] DATE OF BIRTH:  [birth year]  DATE OF ADMISSION:  07/02/2014  REFERRING PHYSICIAN: Kathreen Devoid. Paduchowski, MD   PRIMARY CARE PHYSICIAN: Einar Crow, MD of [facility].   CHIEF COMPLAINT: Fall.   HISTORY OF PRESENT ILLNESS: An 81 year old African American gentleman with a history of dementia and Parkinson's, coronary artery disease, hypertension, essential, presenting after a syncopal episode. The patient is unable to provide meaningful information. History obtained from the family present at bedside. They describe repeated falls over the last 3 days' duration, particularly when going from sitting to standing position. He usually does use a walker his mobility has greatly worsened. No chest pain, further symptomatology.  Today he was attempting to go from sitting to standing, actually passed out, loss of consciousness.  No loss of bowel or bladder function. No postictal confusion. No tonic-clonic activity noted. Brought to the hospital for further workup and evaluation. In the ER noted to be markedly orthostatic.   REVIEW OF SYSTEMS: Unable to obtain given patient's mental status and medical condition.   PAST MEDICAL HISTORY: Dementia, Parkinson's, coronary artery disease, hypertension, essential.   SOCIAL HISTORY: No tobacco, alcohol, or drug usage. He uses a walker for ambulation. Baseline confused but able to communicate  FAMILY HISTORY: No known cardiovascular or pulmonary disorders.   ALLERGIES: No known drug allergies.   HOME MEDICATIONS: Aspirin 81 mg p.o. daily, Rapaflo 8 mg p.o. q. daily, carbidopa/levodopa 50/200 mg half tablet 3 times daily, pramipexole 0.5 mg 3 times daily, metoprolol succinate 25 mg daily, torsemide 5 mg daily, Travatan 0.004% ophthalmic solution 2 drops to each eye at bedtime.   PHYSICAL EXAMINATION:  VITAL SIGNS: Temperature 97.9, heart rate 79, respirations 16, blood pressure 102/52, saturating 100% on room  air. Orthostatic vital signs: Lying heart rate 57, blood pressure 134/66, sitting heart rate 66, blood pressure 101/53. Weight 81.6 kg, BMI of 30.  GENERAL: Chronically-ill, frail-appearing African American  gentleman currently in no acute distress.  HEAD: Normocephalic, atraumatic.  EYES: Pupils equal, round, react to light. Extraocular muscles intact. No scleral icterus.  MOUTH: Dry mucosal membrane. Dentition poor. No abscess noted.  EAR, NOSE, THROAT: Clear without exudates. No external lesions.  NECK: Supple. No thyromegaly. No nodules. No JVD.  PULMONARY: Clear to auscultation bilaterally without wheezes, rales, or rhonchi. No use of accessory muscles. Good respiratory effort.  CHEST: Nontender to palpation.  CARDIOVASCULAR: S1, S2, bradycardic. No murmurs, rubs, or gallops. No edema. Pedal pulses 2+ bilaterally. GASTROINTESTINAL: Soft, nontender, nondistended. No masses. Positive bowel sounds. No hepatosplenomegaly.  MUSCULOSKELETAL: No swelling, clubbing, edema. Range of motion full in all extremities.  NEUROLOGIC: Cranial nerves II through XII intact. No gross focal neurological deficits. Sensation intact. Reflexes intact.  SKIN: No ulceration, lesion, rash, or cyanosis. Skin warm and dry. Turgor intact.  PSYCHIATRIC: Mood, affect within normal limits. The patient is awake, alert, oriented to person. Insight and judgment poor.   LABORATORY DATA: EKG performedevidence of LVH. No ST, T abnormalities. Sodium 141, potassium 4, chloride 106, bicarbonate 29, BUN 18, creatinine 1.22, glucose 130, WBC 3.7, hemoglobin of 13. platelets are 103,000. Urinalysis noevidence of infection.   ASSESSMENT AND PLAN: An 81 year old African American  gentleman with a history of  dementia, Parkinson's, coronary artery disease presenting with syncopal episode.  1.  Orthostatic syncope. We will place on telemetry. Trend cardiac enzymes x 3 and provide IV fluid hydration, hold diuretics.  2.  Hypertension,  essential. Continue with metoprolol and hold further  diuretics.  4.  Parkinson's. Continue with pramipexole and Sinemet.   5.  Venous thromboembolic prophylaxis. Heparin subcutaneous.    CODE STATUS: The patient is full code.   TIME SPENT: 35 minutes.    ____________________________ Cletis Athensavid K. Carter Kaman, MD dkh:AT D: 07/02/2014 23:28:41 ET T: 07/02/2014 23:47:33 ET JOB#: [id number]  cc: Cletis Athensavid K. Darek Eifler, MD, <Dictator> Rosalynd Mcwright Synetta ShadowK Gillie Fleites MD ELECTRONICALLY SIGNED 07/04/2014 13:10

## 2014-08-04 NOTE — Consult Note (Signed)
PATIENT NAME:  Levi Jordan, Levi Jordan MR#:  [id number] DATE OF BIRTH:  1931/07/02  DATE OF CONSULTATION:  07/04/2014  CONSULTING PHYSICIAN:  Pauletta BrownsYuriy Jocelyne Reinertsen, MD  REASON FOR CONSULTATION: Syncope, history of Parkinson disease.   HISTORY OF PRESENT ILLNESS: An 81 year old gentleman with a past medical history of Parkinson disease on pramipexole and Sinemet presenting with a syncopal event. No seizure-like activity. No tongue biting. No urinary incontinence.   REVIEW OF SYSTEMS: Unable to obtain due to a history mental status.   PAST MEDICAL HISTORY: Dementia, Parkinson's, coronary artery disease, hypertension. No history of cardiovascular or pulmonary disease.   ALLERGIES: No drug allergies.   HOME MEDICATIONS: Have been reviewed.   NEUROLOGIC EXAMINATION: The patient is alert, awake, to his name. Could not tell me his location. No focal motor deficits, 4+/5 bilateral upper and lower extremities. Speech appears to be fluent but slow. Extraocular movements are intact. No sensory deficits found.   IMPRESSION: A gentleman with orthostatic syncope with history of Parkinson's disease status post MRI of the brain, no acute intracranial abnormality. Physical therapy, occupational therapy. Dopaminergic medication that patient takes for Parkinson disease can cause orthostatic hypotension. If that is an issue, can add midodrine or Florinef. No further imaging from a neurological standpoint.   Thank you; it was a pleasure seeing this patient.     ____________________________ Pauletta BrownsYuriy Diangelo Radel, MD yz:bm D: 07/04/2014 19:29:36 ET T: 07/05/2014 06:42:23 ET JOB#: [id number]  cc: Pauletta BrownsYuriy Piero Mustard, MD, <Dictator> Pauletta BrownsYURIY Kaegan Hettich MD ELECTRONICALLY SIGNED 07/09/2014 15:50

## 2014-08-04 NOTE — Discharge Summary (Signed)
PATIENT NAME:  Levi Jordan, [name] MR#:  [id number] DATE OF BIRTH:  04-Jan-1932  DATE OF ADMISSION:  07/04/2014 DATE OF DISCHARGE:  07/07/2014  DATE OF BIRTH: Marya AmslerMarshall W. Dareen Jordan, M.D.   FINAL DIAGNOSES:  1. Orthostatic hypotensive syncope with history of hypertension.  2. Parkinson disease.  3. Vitamin D deficiency.  4. Glaucoma, unspecified.  5. Hyperlipidemia, unspecified.   MEDICATIONS ON DISCHARGE: Include aspirin 81 mg daily, metoprolol ER 25 mg daily, pramipexole 0.5 mg 3 times a day, Sinemet 50/200 mg extended-release 1/2 tablet 3 times a day, Travatan Z 0.004% ophthalmic solution 2 drops affected eye at bedtime, simvastatin 40 mg at bedtime, lisinopril 10 mg daily, vitamin D 50,000 units 1 tablet weekly. Stop taking Rapaflo and torsemide.   DIET: Low sodium, low fat, low cholesterol diet.   ACTIVITY: As tolerated.   FOLLOW-UP: In 1 to 2 days with doctor at rehabilitation.   HOSPITAL COURSE: The patient was initially admitted on 07/02/2014 after a fall, found to be orthostatic. Was given IV fluid hydration and torsemide was held.   CONSULTANTS DURING THE HOSPITAL COURSE: Included physical therapy, occupational therapy, and Dr. Loretha BrasilZeylikman, neurology.   LABORATORY AND RADIOLOGICAL DATA DURING THE HOSPITAL COURSE: Included an EKG that showed normal sinus rhythm, LVH.   Troponin negative.   Glucose 130, BUN 18, creatinine 1.22, sodium 141, potassium 4.0, chloride 106, CO2 of 29, calcium 9.0. White blood cell count 3.7, hemoglobin and hematocrit 13.8 and 42.0, platelet count of 103,000.   Chest x-ray showed no active disease.   CT scan of the head showed no acute intracranial abnormality.   Urinalysis negative.   Vitamin D 25-hydroxy is low at 5.7.   Next 2 troponins were negative.   Stool for Clostridium difficile was negative.   Creatinine upon discharge 0.9. Hemoglobin upon discharge 12.8, platelet count of 90,000.   MRI of the brain without contrast showed no acute  intracranial abnormality, mild to moderate chronic small vessel ischemic changes.   Looking back at old platelet counts, platelet count was 90,000 in the past, so this is chronic.   HOSPITAL COURSE PER PROBLEM LIST:  1. For the patient's orthostatic syncope, the patient was given IV fluid hydration. His diuretic was held. Initially, blood pressure medications were held. Blood pressure did go up and has been very variable during the hospital course. Would like his blood pressure on the higher side since he is orthostatic with the Parkinson disease. I am okay with his blood pressure being in the 160s to 170s, because once he stands up, it can down pretty low. Low dose metoprolol and lisinopril given for blood pressure. When taking his blood pressure, be sure to take it when he is not actively having a tremor. Can get falsely elevated readings with that. Blood pressure very variable during the hospital course, highs and lows. Unable to titrate up blood pressure medications for the highs secondary to orthostatic hypotension.  2. Parkinson disease, kept on his usual medications.  3. Weakness. Physical therapy recommended rehabilitation.  4. Glaucoma. Continue Travatan.  5. Vitamin D deficiency. Continue vitamin D supplementation.  6. Hyperlipidemia, unspecified. Continue simvastatin.  TIME SPENT ON DISCHARGE: 35 minutes.    ____________________________ Herschell Dimesichard J. Renae GlossWieting, MD rjw:JT D: 07/07/2014 09:53:19 ET T: 07/07/2014 10:04:59 ET JOB#: [id number]  cc: Herschell Dimesichard J. Renae GlossWieting, MD, <Dictator> [name], MD [facility]  Salley ScarletICHARD J Lilianna Case MD ELECTRONICALLY SIGNED 07/11/2014 15:39

## 2015-02-03 ENCOUNTER — Emergency Department: Payer: Medicare Other

## 2015-02-03 ENCOUNTER — Encounter: Payer: Self-pay | Admitting: Emergency Medicine

## 2015-02-03 ENCOUNTER — Emergency Department
Admission: EM | Admit: 2015-02-03 | Discharge: 2015-02-03 | Disposition: A | Discharge status: Home / Self Care | Payer: Medicare Other | Attending: Student | Admitting: Student

## 2015-02-03 DIAGNOSIS — Z79899 Other long term (current) drug therapy: Secondary | ICD-10-CM | POA: Insufficient documentation

## 2015-02-03 DIAGNOSIS — G20 Parkinson's disease: Secondary | ICD-10-CM | POA: Insufficient documentation

## 2015-02-03 DIAGNOSIS — R531 Weakness: Secondary | ICD-10-CM | POA: Diagnosis present

## 2015-02-03 LAB — CBC
HCT: 39.4 % — ABNORMAL LOW (ref 40.0–52.0)
Hemoglobin: 13.1 g/dL (ref 13.0–18.0)
MCH: 29.6 pg (ref 26.0–34.0)
MCHC: 33.1 g/dL (ref 32.0–36.0)
MCV: 89.4 fL (ref 80.0–100.0)
Platelets: 90 10*3/uL — ABNORMAL LOW (ref 150–440)
RBC: 4.41 MIL/uL (ref 4.40–5.90)
RDW: 16.2 % — AB (ref 11.5–14.5)
WBC: 3.9 10*3/uL (ref 3.8–10.6)

## 2015-02-03 LAB — BASIC METABOLIC PANEL
Anion gap: 10 (ref 5–15)
BUN: 14 mg/dL (ref 6–20)
CALCIUM: 9.1 mg/dL (ref 8.9–10.3)
CO2: 24 mmol/L (ref 22–32)
Chloride: 109 mmol/L (ref 101–111)
Creatinine, Ser: 0.95 mg/dL (ref 0.61–1.24)
GFR calc Af Amer: >60 mL/min (ref >60)
Glucose, Bld: 96 mg/dL (ref 65–99)
POTASSIUM: 3.1 mmol/L — AB (ref 3.5–5.1)
Sodium: 143 mmol/L (ref 135–145)

## 2015-02-03 MED ORDER — SODIUM CHLORIDE 0.9 % IV BOLUS (SEPSIS)
1000.0000 mL | Freq: Once | INTRAVENOUS | Status: AC
  Administered 2015-02-03: 1000 mL via INTRAVENOUS

## 2015-02-03 MED ORDER — CARBIDOPA-LEVODOPA 25-100 MG PO TBDP: 2.0000 | ORAL_TABLET | Freq: Three times a day (TID) | ORAL | Status: DC

## 2015-02-03 NOTE — Discharge Instructions (Signed)
Parkinson Disease Parkinson disease is a disorder of the central nervous system, which includes the brain and spinal cord. A person with this disease slowly loses the ability to completely control body movements. Within the brain, there is a group of nerve cells (basal ganglia) that help control movement. The basal ganglia are damaged and do not work properly in a person with Parkinson disease. In addition, the basal ganglia produce and use a brain chemical called dopamine. The dopamine chemical sends messages to other parts of the body to control and coordinate body movements. Dopamine levels are low in a person with Parkinson disease. If the dopamine levels are low, then the body does not receive the correct messages it needs to move normally.  CAUSES  The exact reason why the basal ganglia get damaged is not known. Some medical researchers have thought that infection, genes, environment, and certain medicines may contribute to the cause.  SYMPTOMS   An early symptom of Parkinson disease is often an uncontrolled shaking (tremor) of the hands. The tremor will often disappear when the affected hand is consciously used.  As the disease progresses, walking, talking, getting out of a chair, and new movements become more difficult.  Muscles get stiff and movements become slower.  Balance and coordination become harder.  Depression, trouble swallowing, urinary problems, constipation, and sleep problems can occur.  Later in the disease, memory and thought processes may deteriorate. DIAGNOSIS  There are no specific tests to diagnose Parkinson disease. You may be referred to a neurologist for evaluation. Your caregiver will ask about your medical history, symptoms, and perform a physical exam. Blood tests and imaging tests of your brain may be performed to rule out other diseases. The imaging tests may include an MRI or a CT scan. TREATMENT  The goal of treatment is to relieve symptoms. Medicines may be  prescribed once the symptoms become troublesome. Medicine will not stop the progression of the disease, but medicine can make movement and balance better and help control tremors. Speech and occupational therapy may also be prescribed. Sometimes, surgical treatment of the brain can be done in young people. HOME CARE INSTRUCTIONS  Get regular exercise and rest periods during the day to help prevent exhaustion and depression.  If getting dressed becomes difficult, replace buttons and zippers with Velcro and elastic on your clothing.  Take all medicine as directed by your caregiver.  Install grab bars or railings in your home to prevent falls.  Go to speech or occupational therapy as directed.  Keep all follow-up visits as directed by your caregiver. SEEK MEDICAL CARE IF:  Your symptoms are not controlled with your medicine.  You fall.  You have trouble swallowing or choke on your food. MAKE SURE YOU:  Understand these instructions.  Will watch your condition.  Will get help right away if you are not doing well or get worse.   This information is not intended to replace advice given to you by your health care provider. Make sure you discuss any questions you have with your health care provider.   Document Released: 03/19/2000 Document Revised: 07/17/2012 Document Reviewed: 04/21/2011 Elsevier Interactive Patient Education 2016 [employer]  

## 2015-02-03 NOTE — ED Notes (Signed)
Pt to ed with c/o weakness increasing x 2 weeks.  Pt family states decreased appetite, weakness, severe diarrhea.

## 2015-02-03 NOTE — ED Provider Notes (Signed)
[facility] Emergency Department Provider Note  ____________________________________________  Time seen: 1:35 PM  I have reviewed the triage vital signs and the nursing notes.   HISTORY  Chief Complaint Weakness    HPI Levi Jordan is a 81 y.o. male is brought to the ED by his family due to generalized weakness is no worsening over the past 2 weeks or so. He recently ran out of his Parcopa a week or 2 ago as well, and this coincides with the worsening of symptoms. He does drink lots of fluids but has difficulty with eating and frequently seems to choke her have to clear his throat. No shortness of breath fever chills or vomiting. No numbness tingling weakness or falls or head injury.     History reviewed. No pertinent past medical history. Parkinson's disease  There are no active problems to display for this patient.    History reviewed. No pertinent past surgical history.   Current Outpatient Rx  Name  Route  Sig  Dispense  Refill  . carbidopa-levodopa (PARCOPA) 25-100 MG disintegrating tablet   Oral   Take 2 tablets by mouth 3 (three) times daily.         . silodosin (RAPAFLO) 8 MG CAPS capsule   Oral   Take 1 capsule by mouth every evening.         . vitamin B-12 (CYANOCOBALAMIN) 500 MCG tablet   Oral   Take 1 tablet by mouth daily.         . carbidopa-levodopa (PARCOPA) 25-100 MG disintegrating tablet   Oral   Take 2 tablets by mouth 3 (three) times daily.   20 tablet   0      Allergies Review of patient's allergies indicates no known allergies.   History reviewed. No pertinent family history.  Social History Social History  Substance Use Topics  . Smoking status: Never Smoker   . Smokeless tobacco: None  . Alcohol Use: No    Review of Systems  Constitutional:   No fever or chills. No weight changes Eyes:   No blurry vision or double vision.  ENT:   No sore throat. Cardiovascular:   No chest  pain. Respiratory:   No dyspnea or cough. Gastrointestinal:   Negative for abdominal pain, vomiting and diarrhea.  No BRBPR or melena. Genitourinary:   Negative for dysuria, urinary retention, bloody urine, or difficulty urinating. Musculoskeletal:   Negative for back pain. No joint swelling or pain. Skin:   Negative for rash. Neurological:   Generalized weakness with spasticity of the upper extremities and right leg  Psychiatric:  No anxiety or depression.   Endocrine:  No hot/cold intolerance, changes in energy, or sleep difficulty.  10-point ROS otherwise negative.  ____________________________________________   PHYSICAL EXAM:  VITAL SIGNS: ED Triage Vitals  Enc Vitals Group     BP 02/03/15 1330 141/84 mmHg     Pulse Rate 02/03/15 1330 73     Resp 02/03/15 1330 13     Temp --      Temp src --      SpO2 02/03/15 1330 100 %     Weight 02/03/15 1255 180 lb (81.647 kg)     Height --      Head Cir --      Peak Flow --      Pain Score --      Pain Loc --      Pain Edu? --      Excl. in GC? --  Constitutional:   Alert and oriented. Well appearing and in no distress. Eyes:   No scleral icterus. No conjunctival pallor. PERRL. EOMI ENT   Head:   Normocephalic and atraumatic.   Nose:   No congestion/rhinnorhea. No septal hematoma   Mouth/Throat:   Dry mucous membranes, no pharyngeal erythema. No peritonsillar mass. No uvula shift.   Neck:   No stridor. No SubQ emphysema. No meningismus. Hematological/Lymphatic/Immunilogical:   No cervical lymphadenopathy. Cardiovascular:   RRR. Normal and symmetric distal pulses are present in all extremities. No murmurs, rubs, or gallops. Respiratory:   Normal respiratory effort without tachypnea nor retractions. Breath sounds are clear and equal bilaterally. No wheezes/rales/rhonchi. Gastrointestinal:   Soft and nontender. No distention. There is no CVA tenderness.  No rebound, rigidity, or guarding. Genitourinary:    deferred Musculoskeletal:   Nontender with normal range of motion in all extremities. No joint effusions.  No lower extremity tenderness.  No edema. Neurologic:   Normal speech and language.  CN 2-10 normal. Bilateral upper extremities are held in extensive posture. There is significant cogwheel rigidity of both upper extremities on elbow flexion. Additionally the right lower extremity is also held in extension. The left leg seems to be normal. No other focal findings. Patient unable to ambulate due to these difficulties.. No gross focal neurologic deficits are appreciated.  Skin:    Skin is warm, dry and intact. No rash noted.  No petechiae, purpura, or bullae. Psychiatric:   Mood and affect are normal. Speech and behavior are normal. Patient exhibits appropriate insight and judgment.  ____________________________________________    LABS (pertinent positives/negatives) (all labs ordered are listed, but only abnormal results are displayed) Labs Reviewed  BASIC METABOLIC PANEL - Abnormal; Notable for the following:    Potassium 3.1 (*)    All other components within normal limits  CBC - Abnormal; Notable for the following:    HCT 39.4 (*)    RDW 16.2 (*)    Platelets 90 (*)    All other components within normal limits  CBG MONITORING, ED   ____________________________________________   EKG  Interpreted by me Sinus rhythm rate of rate of 75, normal axis and intervals. Normal QRS. Wandering baseline limits interpretation but no acute ischemic findings appreciable on EKG  ____________________________________________    RADIOLOGY  Chest x-ray unremarkable  ____________________________________________   PROCEDURES   ____________________________________________   INITIAL IMPRESSION / ASSESSMENT AND PLAN / ED COURSE  Pertinent labs & imaging results that were available during my care of the patient were reviewed by me and considered in my medical decision making (see chart  for details).  Patient presents with worsening Parkinson symptoms confirmed by exam in the setting of medication is continuation inadvertently. Probably there was a delay in receiving a refill after ordering it through [employer] insurance [employer] mail-order program. I'll write a short bridging prescription until they Arrives in the male to that the patient can restart on his medications and get the symptoms under control. No other acute findings, vital signs are unremarkable stable except for mild hypertension. Low suspicion for sepsis stroke intracranial hemorrhage or other acute pathology. Apart from the neurologic finding, the patient is calm and at ease.     ____________________________________________   FINAL CLINICAL IMPRESSION(S) / ED DIAGNOSES  Final diagnoses:  Parkinson disease, symptomatic (HCC)      Sharman CheekPhillip Rasean Joos, MD 02/03/15 1537

## 2015-03-18 ENCOUNTER — Emergency Department

## 2015-03-18 ENCOUNTER — Observation Stay
Admission: EM | Admit: 2015-03-18 | Discharge: 2015-03-19 | Disposition: A | Discharge status: Home / Self Care | Attending: Internal Medicine | Admitting: Internal Medicine

## 2015-03-18 ENCOUNTER — Encounter: Payer: Self-pay | Admitting: Emergency Medicine

## 2015-03-18 DIAGNOSIS — N390 Urinary tract infection, site not specified: Secondary | ICD-10-CM | POA: Insufficient documentation

## 2015-03-18 DIAGNOSIS — Z79899 Other long term (current) drug therapy: Secondary | ICD-10-CM | POA: Diagnosis not present

## 2015-03-18 DIAGNOSIS — D696 Thrombocytopenia, unspecified: Secondary | ICD-10-CM | POA: Insufficient documentation

## 2015-03-18 DIAGNOSIS — E876 Hypokalemia: Secondary | ICD-10-CM | POA: Diagnosis not present

## 2015-03-18 DIAGNOSIS — L89309 Pressure ulcer of unspecified buttock, unspecified stage: Secondary | ICD-10-CM | POA: Diagnosis not present

## 2015-03-18 DIAGNOSIS — R1310 Dysphagia, unspecified: Secondary | ICD-10-CM | POA: Insufficient documentation

## 2015-03-18 DIAGNOSIS — L8990 Pressure ulcer of unspecified site, unspecified stage: Secondary | ICD-10-CM | POA: Insufficient documentation

## 2015-03-18 DIAGNOSIS — Z9660 Presence of unspecified orthopedic joint implant: Secondary | ICD-10-CM | POA: Diagnosis not present

## 2015-03-18 DIAGNOSIS — I6523 Occlusion and stenosis of bilateral carotid arteries: Secondary | ICD-10-CM | POA: Diagnosis not present

## 2015-03-18 DIAGNOSIS — R601 Generalized edema: Secondary | ICD-10-CM | POA: Diagnosis not present

## 2015-03-18 DIAGNOSIS — R1110 Vomiting, unspecified: Secondary | ICD-10-CM | POA: Diagnosis present

## 2015-03-18 DIAGNOSIS — F0390 Unspecified dementia without behavioral disturbance: Secondary | ICD-10-CM | POA: Diagnosis not present

## 2015-03-18 DIAGNOSIS — E860 Dehydration: Secondary | ICD-10-CM | POA: Insufficient documentation

## 2015-03-18 DIAGNOSIS — T18108A Unspecified foreign body in esophagus causing other injury, initial encounter: Secondary | ICD-10-CM | POA: Diagnosis present

## 2015-03-18 DIAGNOSIS — R159 Full incontinence of feces: Secondary | ICD-10-CM | POA: Insufficient documentation

## 2015-03-18 DIAGNOSIS — Z515 Encounter for palliative care: Secondary | ICD-10-CM | POA: Insufficient documentation

## 2015-03-18 DIAGNOSIS — X58XXXA Exposure to other specified factors, initial encounter: Secondary | ICD-10-CM | POA: Insufficient documentation

## 2015-03-18 DIAGNOSIS — T18198A Other foreign object in esophagus causing other injury, initial encounter: Principal | ICD-10-CM | POA: Insufficient documentation

## 2015-03-18 DIAGNOSIS — Z823 Family history of stroke: Secondary | ICD-10-CM | POA: Insufficient documentation

## 2015-03-18 DIAGNOSIS — M8580 Other specified disorders of bone density and structure, unspecified site: Secondary | ICD-10-CM | POA: Diagnosis not present

## 2015-03-18 DIAGNOSIS — Z87891 Personal history of nicotine dependence: Secondary | ICD-10-CM | POA: Insufficient documentation

## 2015-03-18 DIAGNOSIS — F0280 Dementia in other diseases classified elsewhere without behavioral disturbance: Secondary | ICD-10-CM | POA: Diagnosis not present

## 2015-03-18 DIAGNOSIS — G20 Parkinson's disease: Secondary | ICD-10-CM | POA: Insufficient documentation

## 2015-03-18 HISTORY — DX: Parkinson's disease without dyskinesia, without mention of fluctuations: G20.A1

## 2015-03-18 HISTORY — DX: Parkinson's disease: G20

## 2015-03-18 HISTORY — DX: Unspecified dementia, unspecified severity, without behavioral disturbance, psychotic disturbance, mood disturbance, and anxiety: F03.90

## 2015-03-18 LAB — URINALYSIS COMPLETE WITH MICROSCOPIC (ARMC ONLY)
BACTERIA UA: NONE SEEN
Bilirubin Urine: NEGATIVE
Glucose, UA: NEGATIVE mg/dL
Nitrite: NEGATIVE
PH: 6 (ref 5.0–8.0)
Protein, ur: 100 mg/dL — AB
Specific Gravity, Urine: 1.024 (ref 1.005–1.030)
Squamous Epithelial / LPF: NONE SEEN

## 2015-03-18 LAB — CBC WITH DIFFERENTIAL/PLATELET
BASOS ABS: 0 10*3/uL (ref 0–0.1)
Basophils Relative: 1 %
EOS PCT: 3 %
Eosinophils Absolute: 0.1 10*3/uL (ref 0–0.7)
HCT: 40.6 % (ref 40.0–52.0)
Hemoglobin: 13.7 g/dL (ref 13.0–18.0)
Lymphocytes Relative: 37 %
Lymphs Abs: 1.2 10*3/uL (ref 1.0–3.6)
MCH: 29.6 pg (ref 26.0–34.0)
MCHC: 33.7 g/dL (ref 32.0–36.0)
MCV: 87.9 fL (ref 80.0–100.0)
Monocytes Absolute: 0.3 10*3/uL (ref 0.2–1.0)
Monocytes Relative: 8 %
NEUTROS ABS: 1.8 10*3/uL (ref 1.4–6.5)
Neutrophils Relative %: 51 %
PLATELETS: 125 10*3/uL — AB (ref 150–440)
RBC: 4.61 MIL/uL (ref 4.40–5.90)
RDW: 16.5 % — ABNORMAL HIGH (ref 11.5–14.5)
WBC: 3.4 10*3/uL — ABNORMAL LOW (ref 3.8–10.6)

## 2015-03-18 LAB — COMPREHENSIVE METABOLIC PANEL WITH GFR
ALT: <5 U/L — ABNORMAL LOW (ref 17–63)
AST: 16 U/L (ref 15–41)
Albumin: 3.4 g/dL — ABNORMAL LOW (ref 3.5–5.0)
Alkaline Phosphatase: 26 U/L — ABNORMAL LOW (ref 38–126)
Anion gap: 6 (ref 5–15)
BUN: 11 mg/dL (ref 6–20)
CO2: 34 mmol/L — ABNORMAL HIGH (ref 22–32)
Calcium: 9.1 mg/dL (ref 8.9–10.3)
Chloride: 101 mmol/L (ref 101–111)
Creatinine, Ser: 0.65 mg/dL (ref 0.61–1.24)
GFR calc Af Amer: >60 mL/min
GFR calc non Af Amer: >60 mL/min
Glucose, Bld: 113 mg/dL — ABNORMAL HIGH (ref 65–99)
Potassium: 2.8 mmol/L — CL (ref 3.5–5.1)
Sodium: 141 mmol/L (ref 135–145)
Total Bilirubin: 0.6 mg/dL (ref 0.3–1.2)
Total Protein: 6.9 g/dL (ref 6.5–8.1)

## 2015-03-18 LAB — MAGNESIUM: Magnesium: 2.1 mg/dL (ref 1.7–2.4)

## 2015-03-18 MED ORDER — ACETAMINOPHEN 325 MG PO TABS
650.0000 mg | ORAL_TABLET | Freq: Four times a day (QID) | ORAL | Status: DC | PRN
  Administered 2015-03-18: 650 mg via ORAL
  Filled 2015-03-18: qty 2

## 2015-03-18 MED ORDER — ACETAMINOPHEN 650 MG RE SUPP: 650.0000 mg | Freq: Four times a day (QID) | RECTAL | Status: DC | PRN

## 2015-03-18 MED ORDER — GI COCKTAIL ~~LOC~~
30.0000 mL | Freq: Once | ORAL | Status: AC
  Administered 2015-03-18: 30 mL via ORAL
  Filled 2015-03-18: qty 30

## 2015-03-18 MED ORDER — ONDANSETRON HCL 4 MG/2ML IJ SOLN
4.0000 mg | Freq: Once | INTRAMUSCULAR | Status: AC
  Administered 2015-03-18: 4 mg via INTRAVENOUS
  Filled 2015-03-18: qty 2

## 2015-03-18 MED ORDER — GLUCAGON HCL (RDNA) 1 MG IJ SOLR
0.5000 mg | Freq: Once | INTRAMUSCULAR | Status: AC
  Administered 2015-03-18: 0.5 mg via INTRAVENOUS
  Filled 2015-03-18: qty 1

## 2015-03-18 MED ORDER — POTASSIUM CHLORIDE 10 MEQ/100ML IV SOLN
10.0000 meq | INTRAVENOUS | Status: AC
  Administered 2015-03-18 (×2): 10 meq via INTRAVENOUS
  Filled 2015-03-18 (×2): qty 100

## 2015-03-18 MED ORDER — CARBIDOPA-LEVODOPA 25-100 MG PO TABS
1.5000 | ORAL_TABLET | Freq: Three times a day (TID) | ORAL | Status: DC
Start: 2015-03-18 — End: 2015-03-19
  Administered 2015-03-18 – 2015-03-19 (×3): 1.5 via ORAL
  Filled 2015-03-18 (×3): qty 2

## 2015-03-18 MED ORDER — CARBIDOPA-LEVODOPA 25-100 MG PO TBDP: 1.5000 | ORAL_TABLET | Freq: Three times a day (TID) | ORAL | Status: DC

## 2015-03-18 MED ORDER — DEXTROSE 5 % IV SOLN
1.0000 g | Freq: Once | INTRAVENOUS | Status: AC
  Administered 2015-03-18: 1 g via INTRAVENOUS
  Filled 2015-03-18: qty 10

## 2015-03-18 MED ORDER — CYANOCOBALAMIN 500 MCG PO TABS
500.0000 ug | ORAL_TABLET | Freq: Every day | ORAL | Status: DC
  Administered 2015-03-19: 500 ug via ORAL
  Filled 2015-03-18: qty 1

## 2015-03-18 MED ORDER — SODIUM CHLORIDE 0.9 % IV BOLUS (SEPSIS)
1000.0000 mL | Freq: Once | INTRAVENOUS | Status: AC
  Administered 2015-03-18: 1000 mL via INTRAVENOUS

## 2015-03-18 MED ORDER — ENOXAPARIN SODIUM 40 MG/0.4ML ~~LOC~~ SOLN
40.0000 mg | SUBCUTANEOUS | Status: DC
  Administered 2015-03-18: 40 mg via SUBCUTANEOUS
  Filled 2015-03-18: qty 0.4

## 2015-03-18 MED ORDER — TAMSULOSIN HCL 0.4 MG PO CAPS
0.4000 mg | ORAL_CAPSULE | Freq: Every day | ORAL | Status: DC
  Filled 2015-03-18: qty 1

## 2015-03-18 MED ORDER — POTASSIUM CHLORIDE IN NACL 20-0.9 MEQ/L-% IV SOLN
INTRAVENOUS | Status: DC
  Administered 2015-03-18: 23:00:00 via INTRAVENOUS
  Filled 2015-03-18 (×2): qty 1000

## 2015-03-18 MED ORDER — MAGNESIUM SULFATE 2 GM/50ML IV SOLN
2.0000 g | Freq: Once | INTRAVENOUS | Status: AC
  Administered 2015-03-18: 2 g via INTRAVENOUS
  Filled 2015-03-18: qty 50

## 2015-03-18 NOTE — ED Notes (Signed)
Pt from home via ems with difficulty swallowing during lunch. Lucila MaineGrandson states that he has had difficulty swallowing before but this time he even had trouble controlling his saliva and was drooling. Pt told grandson, "I'm getting ready to leave you."  Pt is under hospice care for Parkinson's Disease. Pt has swollen and contracted hands. The contractions have been present for approx 2 months, as the swelling for approx 2 weeks. Pt has foley catheter in place due to bedsores (to allow them to dry out and heal better). Foley has been in place about a week, and has UTI since Friday.

## 2015-03-18 NOTE — ED Provider Notes (Signed)
CSN: [id number]     Arrival date & time 03/18/15  1527 History   First MD Initiated Contact with Patient 03/18/15 1528     Chief Complaint  Patient presents with  . Dysphagia     (Consider location/radiation/quality/duration/timing/severity/associated sxs/prior Treatment) The history is provided by the patient and a relative.  Levi Jordan is a 81 y.o. male hx of dementia on hospice here with dysphagia. He has been having dysphagia symptoms for about the last month or so. Has been taking thickened liquids and pured foods only. Today, progressively tried to feed him and then he then felt that his throat was feeling tight and he keep on spitting up the food. Denies any chest pain or shortness of breath. Denies any abdominal pain. Of note, patient has indwelling Foley as well as a stage I sacral decubitus ulcer. He lives at home with home care from hospice. Patient is a DO NOT RESUSCITATE.      Level V caveat- dementia   Past Medical History  Diagnosis Date  . Parkinson disease (HCC)    History reviewed. No pertinent past surgical history. History reviewed. No pertinent family history. Social History  Substance Use Topics  . Smoking status: Never Smoker   . Smokeless tobacco: None  . Alcohol Use: No    Review of Systems  Gastrointestinal: Positive for vomiting.  All other systems reviewed and are negative.     Allergies  Review of patient's allergies indicates no known allergies.  Home Medications   Prior to Admission medications   Medication Sig Start Date End Date Taking? Authorizing Provider  carbidopa-levodopa (PARCOPA) 25-100 MG disintegrating tablet Take 2 tablets by mouth 3 (three) times daily. 01/28/15 04/28/15  Historical Provider, MD  carbidopa-levodopa (PARCOPA) 25-100 MG disintegrating tablet Take 2 tablets by mouth 3 (three) times daily. 02/03/15   Sharman CheekPhillip Stafford, MD  silodosin (RAPAFLO) 8 MG CAPS capsule Take 1 capsule by mouth every evening.     Historical Provider, MD  vitamin B-12 (CYANOCOBALAMIN) 500 MCG tablet Take 1 tablet by mouth daily.    Historical Provider, MD   BP 130/66 mmHg  Pulse 66  Temp(Src) 98 F (36.7 C) (Oral)  Resp 8  Ht 5\' 10"  (1.778 m)  Wt 137 lb (62.143 kg)  BMI 19.66 kg/m2  SpO2 99% Physical Exam  Constitutional:  Chronically ill, dehydrated   HENT:  Head: Normocephalic.  MM dry, a piece of noodle in his mouth, no other foreign body visible   Eyes: Conjunctivae are normal. Pupils are equal, round, and reactive to light.  Neck: Normal range of motion.  No stridor, no LAD   Cardiovascular: Normal rate, regular rhythm and normal heart sounds.   Pulmonary/Chest: Effort normal and breath sounds normal. No respiratory distress. He has no wheezes. He has no rales.  Abdominal: Soft. Bowel sounds are normal.  Distended, nontender. Foley in place   Musculoskeletal: Normal range of motion.  Neurological: He is alert.  Skin: Skin is warm and dry.  Psychiatric:  Unable   Nursing note and vitals reviewed.   ED Course  Procedures (including critical care time) Labs Review Labs Reviewed  CBC WITH DIFFERENTIAL/PLATELET - Abnormal; Notable for the following:    WBC 3.4 (*)    RDW 16.5 (*)    Platelets 125 (*)    All other components within normal limits  COMPREHENSIVE METABOLIC PANEL - Abnormal; Notable for the following:    Potassium 2.8 (*)    CO2 34 (*)    Glucose,  Bld 113 (*)    Albumin 3.4 (*)    ALT <5 (*)    Alkaline Phosphatase 26 (*)    All other components within normal limits  URINALYSIS COMPLETEWITH MICROSCOPIC ([facility] ONLY) - Abnormal; Notable for the following:    Color, Urine AMBER (*)    APPearance CLOUDY (*)    Ketones, ur TRACE (*)    Hgb urine dipstick 3+ (*)    Protein, ur 100 (*)    Leukocytes, UA 2+ (*)    All other components within normal limits  URINE CULTURE    Imaging Review Dg Neck Soft Tissue  03/18/2015  CLINICAL DATA:  Difficulty swallowing during lunch.  Lucila Maine states that he has had difficulty swallowing before but this time he even had trouble controlling his saliva and was drooling. Pt told grandson, "I'm getting ready to leave you." Pt is under hospice care for Parkinson's Disease. Pt has swollen and contracted hands. The contractions have been present for approx 2 months, as the swelling for approx 2 weeks. Pt has foley catheter in place due to bedsores (to allow them to dry out and heal better). Foley has been in place about a week, and has UTI since Friday EXAM: NECK SOFT TISSUES - 1+ VIEW COMPARISON:  None. FINDINGS: 11 mm oval density projects in the region of the valleculae. There is no evidence of retropharyngeal soft tissue swelling or epiglottic enlargement. The cervical airway is unremarkable . Degenerative disc disease with small anterior endplate spurs C3-T1. Left carotid bifurcation calcified plaque. IMPRESSION: 1. Possible 11 mm nonocclusive foreign body at the level of the valleculae. Electronically Signed   By: Corlis Leak M.D.   On: 03/18/2015 16:38   Dg Chest 2 View  03/18/2015  CLINICAL DATA:  Difficulty swallowing, history of Parkinson's EXAM: CHEST - 2 VIEW COMPARISON:  02/03/2015 FINDINGS: Cardiac shadow is at the upper limits of normal in size. Aortic calcifications are again seen. The lungs are clear bilaterally. Scattered large and small bowel gas is noted below the diaphragm. This may represent a component of ileus. No other focal abnormality is seen. IMPRESSION: No acute intrathoracic abnormality. Gas-filled loops of small bowel in the upper abdomen. This suggests an underlying ileus. Electronically Signed   By: Alcide Clever M.D.   On: 03/18/2015 16:27   Ct Soft Tissue Neck Wo Contrast  03/18/2015  CLINICAL DATA:  Possible foreign body in throat. Parkinson's disease. Hospice care. EXAM: CT NECK WITHOUT CONTRAST TECHNIQUE: Multidetector CT imaging of the neck was performed following the standard protocol without intravenous  contrast. COMPARISON:  Plain film of earlier today. FINDINGS: Pharynx and larynx: Normal nasopharynx. Radiopaque foreign object at the level of the left side of the valleculae measures 8 mm on image 56/series 2. Normal larynx. Salivary glands: Diminutive but symmetric submandibular and parotid glands. Thyroid: Within normal limits. Lymph nodes: No adenopathy. Evaluation limited by lack of IV contrast and paucity of fat. Vascular: Bilateral carotid atherosclerosis. Limited intracranial: Unremarkable Visualized orbits: Normal in appearance. Mastoids and visualized paranasal sinuses: Clear mastoid air cells. Partial opacification of the right maxillary sinus. Hyperostosis frontalis incidentally noted. Multiple periapical lucencies within the maxilla. Skeleton: Cervical spondylosis. Upper chest: Aortic and branch vessel atherosclerosis. Clear lung apices. Anasarca. IMPRESSION: 1. 8 mm radiopaque foreign object positioned within the left vallecula. 2. Atherosclerosis. 3. Sinus disease. Electronically Signed   By: Jeronimo Greaves M.D.   On: 03/18/2015 18:36   Dg Abd 2 Views  03/18/2015  CLINICAL DATA:  Difficulty swallowing,  history of Parkinson's disease EXAM: ABDOMEN - 2 VIEW COMPARISON:  10/26/2013 FINDINGS: Scattered large and small bowel gas is noted. No obstructive changes are seen. These changes may represent underlying ileus. No free air is noted. Degenerative changes of the lumbar spine are seen. IMPRESSION: Changes which may represent a generalized ileus. Correlation with physical exam is recommended. No obstructive changes are seen. No free air is noted. Electronically Signed   By: Alcide Clever M.D.   On: 03/18/2015 17:08   I have personally reviewed and evaluated these images and lab results as part of my medical decision-making.   EKG Interpretation None      MDM   Final diagnoses:  Vomiting    Draden Cottingham is a 81 y.o. male here with vomiting vs spitting up food. Food impaction vs SBO vs  ileus. He is dehydrated and will need IVF. Patient is hospice so I discussed goals of care with family. In particular, I discussed risks and benefits of PEG tube for hydration.   4:30 pm Hospice nurse at bedside. Xray showed possible foreign object in throat. Will order glucagon. States that he had dark stool. Guiac neg at bedside. Hg stable.   5 pm Given glucagon and felt slightly better. K 2.8. Also has UTI despite being on cipro. Will change out foley and give ceftriaxone.   7:35 PM CT showed L vallecula foreign body. Consulted GI, Dr. Shelle Iron, who recommend ENT consult. Consulted Dr. Jenne Campus, who feels that it will pass on its own. Recommend clear liquid diet and observation. If not improving then consult ENT in AM.      Richardean Canal, MD 03/18/15 [phone]

## 2015-03-18 NOTE — Progress Notes (Signed)
ED Visit made. Patient is currently followed at home by [facility] with a  Hospice diagnosis of Parkinson's and is a Full Code. Notified by patient's hospice nurse that family was calling EMS for transport [facility] ED for evaluation of increased upper extremity edema, dark stools x 1 day and increased pain "all over." Patient seen lying on the ED stretcher, eyes closed, easily responded to verbal stimuli, denied pain. Patient's son Iantha FallenKenneth and grandson Debby Budndre present at bedside. Debby Budndre reports that he was feeding his grandfather food and his pills at lunch time and that patient had increased difficulty swallowing. Xray of the soft tissue of the neck revealed an 11 mm object in the patient's throat, thought to be an undissolved pill. Patient to receive glucagon IV as this helps the throat to relax and may dislodge object/pill. Writer discussed using pureed foods with Debby Budndre, giving small bites, and time between bites as well as making sure [name] was sitting up straight when fed. Attending physician Dr. Thurnell LoseYow had brought up the possibility of a feeding tube being placed with family, they had questions regarding the risks and benefits, questions answered. Writer discussed the progression of Parkinson's, crushing medications or eliminating them. Writer encouraged Iantha FallenKenneth and Debby Budndre to discuss this again with their hospice team and patient's PCP Dr. Dareen PianoAnderson. Current plan is for patient to return home via EMS. Per discussion with Dr. Thurnell LoseYow a noncontrast CT of the neck will be ordered to confirm passage of object from patient's throat. Writer assisted staff RN Mary in repositioning and changing patient into a clean brief, stool light brown in color, very loose, note on brief. Patient is currently on Cipro for a urinary tract infection, foley catheter in place draining amber colored urine. Lab results show hypokalemia at 2.8, patient to receive IV potassium.  Debby Budndre and Iantha FallenKenneth  encouraged to contrat hospice with any needs after returning home, they voiced understanding. Update provided to Hospice team. Thank you. Dayna BarkerKaren Robertson RN, BSN, [facility], [facility] [phone]

## 2015-03-18 NOTE — H&P (Signed)
[facility] [place] [facility]   PATIENT NAME: Levi Jordan Jordan    MR#:  [id number]  DATE OF BIRTH:  09/30/1931  DATE OF ADMISSION:  03/18/2015  PRIMARY CARE PHYSICIAN: Levi RegulusANDERSON,MARSHALL W., MD   REQUESTING/REFERRING PHYSICIAN: Chaney Mallingavid Jordan  CHIEF COMPLAINT:   Chief Complaint  Patient presents with  . Dysphagia    HISTORY OF PRESENT ILLNESS:  Levi Jordan  is a 81 y.o. male with a known history of Parkinson's disease and dementia. Today after medications he was eating and he felt something get caught in his throat. He follows with hospice at home. He was brought into the ER for further evaluation. Initially the patient was gurgling on secretions. The patient was given IV glucagon and then felt a little bit better afterwards. He had a CT scan done of the neck which showed a 8 mm radiopaque object in the left vallecula. When I saw the patient, he was not gurgling on secretions and he is feeling a little bit better but still complaining of tightness in his throat and chest.  PAST MEDICAL HISTORY:   Past Medical History  Diagnosis Date  . Parkinson disease (HCC)   . Dementia     PAST SURGICAL HISTORY:   Past Surgical History  Procedure Laterality Date  . Joint replacement      SOCIAL HISTORY:   Social History  Substance Use Topics  . Smoking status: Former Games developermoker  . Smokeless tobacco: Not on file  . Alcohol Use: No    FAMILY HISTORY:   Family History  Problem Relation Age of Onset  . Stroke Mother     DRUG ALLERGIES:  No Known Allergies  REVIEW OF SYSTEMS:  CONSTITUTIONAL: No fever, positive for weakness. Positive for weight loss. EYES: No blurred or double vision. Wears glasses EARS, NOSE, AND THROAT: No tinnitus or ear pain. No sore throat. Positive for dysphagia for solids RESPIRATORY: No cough, shortness of breath, wheezing or hemoptysis.  CARDIOVASCULAR: Positive for chest pain, no orthopnea, edema.  GASTROINTESTINAL: No  nausea, vomiting, or abdominal pain. No blood in bowel movements. Positive diarrhea every day. GENITOURINARY: Catheter for the past week to let decubiti heel  ENDOCRINE: No polyuria, nocturia,  HEMATOLOGY: No anemia, easy bruising or bleeding SKIN: As per ER physician stage I decubiti buttock MUSCULOSKELETAL: No joint pain or arthritis.   NEUROLOGIC: No tingling, numbness, weakness.  PSYCHIATRY: No anxiety or depression.   MEDICATIONS AT HOME:   Prior to Admission medications   Medication Sig Start Date End Date Taking? Authorizing Provider  carbidopa-levodopa (PARCOPA) 25-100 MG disintegrating tablet Take 1.5 tablets by mouth 3 (three) times daily.   Yes Historical Provider, MD  ciprofloxacin (CIPRO) 500 MG tablet Take 500 mg by mouth 2 (two) times daily. 03/13/15 03/20/15 Yes Historical Provider, MD  silodosin (RAPAFLO) 8 MG CAPS capsule Take 8 mg by mouth every evening.    Yes Historical Provider, MD  vitamin B-12 (CYANOCOBALAMIN) 500 MCG tablet Take 500 mcg by mouth daily.    Yes Historical Provider, MD      VITAL SIGNS:  Blood pressure 130/73, pulse 64, temperature 98 F (36.7 C), temperature source Oral, resp. rate 12, height 5\' 10"  (1.778 m), weight 62.143 kg (137 lb), SpO2 98 %.  PHYSICAL EXAMINATION:  GENERAL:  81 y.o.-year-old patient lying in the bed with no acute distress.  EYES: Pupils equal, round, reactive to light and accommodation. No scleral icterus. Extraocular muscles intact.  HEENT: Head atraumatic, normocephalic. Oropharynx and nasopharynx clear.  NECK:  Supple, no jugular venous distention. No thyroid enlargement, no tenderness.  LUNGS: Normal breath sounds bilaterally, no wheezing, rales,rhonchi or crepitation. No use of accessory muscles of respiration.  CARDIOVASCULAR: S1, S2 normal. 2/6 systolic murmur, no rubs, or gallops.  ABDOMEN: Soft, nontender, nondistended. Bowel sounds present. No organomegaly or mass.  EXTREMITIES: No pedal edema, cyanosis, or  clubbing.  NEUROLOGIC: Patient able to go through her swallowing motion without choking. Barely able to straight leg raise.  PSYCHIATRIC: The patient is alert.  SKIN: As per ER physician stage I decubiti on buttock  LABORATORY PANEL:   CBC  Recent Labs Lab 03/18/15 1639  WBC 3.4*  HGB 13.7  HCT 40.6  PLT 125*   ------------------------------------------------------------------------------------------------------------------  Chemistries   Recent Labs Lab 03/18/15 1639  NA 141  K 2.8*  CL 101  CO2 34*  GLUCOSE 113*  BUN 11  CREATININE 0.65  CALCIUM 9.1  AST 16  ALT <5*  ALKPHOS 26*  BILITOT 0.6   ------------------------------------------------------------------------------------------------------------------  RADIOLOGY:  Dg Neck Soft Tissue  03/18/2015  CLINICAL DATA:  Difficulty swallowing during lunch. Levi Jordan states that he has had difficulty swallowing before but this time he even had trouble controlling his saliva and was drooling. Pt told grandson, "I'm getting ready to leave you." Pt is under hospice care for Parkinson's Disease. Pt has swollen and contracted hands. The contractions have been present for approx 2 months, as the swelling for approx 2 weeks. Pt has foley catheter in place due to bedsores (to allow them to dry out and heal better). Foley has been in place about a week, and has UTI since Friday EXAM: NECK SOFT TISSUES - 1+ VIEW COMPARISON:  None. FINDINGS: 11 mm oval density projects in the region of the valleculae. There is no evidence of retropharyngeal soft tissue swelling or epiglottic enlargement. The cervical airway is unremarkable . Degenerative disc disease with small anterior endplate spurs C3-T1. Left carotid bifurcation calcified plaque. IMPRESSION: 1. Possible 11 mm nonocclusive foreign body at the level of the valleculae. Electronically Signed   By: Corlis Leak M.D.   On: 03/18/2015 16:38   Dg Chest 2 View  03/18/2015  CLINICAL DATA:   Difficulty swallowing, history of Parkinson's EXAM: CHEST - 2 VIEW COMPARISON:  02/03/2015 FINDINGS: Cardiac shadow is at the upper limits of normal in size. Aortic calcifications are again seen. The lungs are clear bilaterally. Scattered large and small bowel gas is noted below the diaphragm. This may represent a component of ileus. No other focal abnormality is seen. IMPRESSION: No acute intrathoracic abnormality. Gas-filled loops of small bowel in the upper abdomen. This suggests an underlying ileus. Electronically Signed   By: Alcide Clever M.D.   On: 03/18/2015 16:27   Ct Soft Tissue Neck Wo Contrast  03/18/2015  CLINICAL DATA:  Possible foreign body in throat. Parkinson's disease. Hospice care. EXAM: CT NECK WITHOUT CONTRAST TECHNIQUE: Multidetector CT imaging of the neck was performed following the standard protocol without intravenous contrast. COMPARISON:  Plain film of earlier today. FINDINGS: Pharynx and larynx: Normal nasopharynx. Radiopaque foreign object at the level of the left side of the valleculae measures 8 mm on image 56/series 2. Normal larynx. Salivary glands: Diminutive but symmetric submandibular and parotid glands. Thyroid: Within normal limits. Lymph nodes: No adenopathy. Evaluation limited by lack of IV contrast and paucity of fat. Vascular: Bilateral carotid atherosclerosis. Limited intracranial: Unremarkable Visualized orbits: Normal in appearance. Mastoids and visualized paranasal sinuses: Clear mastoid air cells. Partial opacification of  the right maxillary sinus. Hyperostosis frontalis incidentally noted. Multiple periapical lucencies within the maxilla. Skeleton: Cervical spondylosis. Upper chest: Aortic and branch vessel atherosclerosis. Clear lung apices. Anasarca. IMPRESSION: 1. 8 mm radiopaque foreign object positioned within the left vallecula. 2. Atherosclerosis. 3. Sinus disease. Electronically Signed   By: Jeronimo Greaves M.D.   On: 03/18/2015 18:36   Dg Abd 2  Views  03/18/2015  CLINICAL DATA:  Difficulty swallowing, history of Parkinson's disease EXAM: ABDOMEN - 2 VIEW COMPARISON:  10/26/2013 FINDINGS: Scattered large and small bowel gas is noted. No obstructive changes are seen. These changes may represent underlying ileus. No free air is noted. Degenerative changes of the lumbar spine are seen. IMPRESSION: Changes which may represent a generalized ileus. Correlation with physical exam is recommended. No obstructive changes are seen. No free air is noted. Electronically Signed   By: Alcide Clever M.D.   On: 03/18/2015 17:08    EKG:   Dr. Renae Gloss will order  IMPRESSION AND PLAN:   1. Foreign body in vallecula likely a pill. Hopefully this will dissolve and pass on its own. ER physician spoke with GI who recommended ENT consult ER physician spoke with ENT on call recommended ENT consultation in the a.m. and clear liquid diet. I will also get a swallow evaluation tomorrow morning because of his Parkinson's disease and dysphagia. 2. Parkinson's disease- continue Sinemet 3. Hypokalemia- replace potassium IV and check a magnesium and replace that if low. 4. Decubitus ulcer on buttock will get wound care consultation 5. Chronic Foley catheter with positive urine analysis. Foley catheter changed in the emergency room. No need for antibiotics since the patient is not septic. Changing the catheter will be all that is needed. 6. Thrombocytopenia- looking back at old labs this is not new 7. Patient followed by hospice. Family initially was thinking about the CODE STATUS when I mentioned this and now will make him a DO NOT RESUSCITATE.  All the records are reviewed and case discussed with ED provider. Management plans discussed with the patient, family and they are in agreement.  CODE STATUS: DO NOT RESUSCITATE  TOTAL TIME TAKING CARE OF THIS PATIENT: 55 minutes.    Alford Highland M.D on 03/18/2015 at 9:08 PM  Between 7am to 6pm - Pager -  [phone]  After 6pm call admission pager [phone]  [facility]  Office  [phone]  CC: Primary care physician; Levi Regulus., MD

## 2015-03-19 DIAGNOSIS — L8990 Pressure ulcer of unspecified site, unspecified stage: Secondary | ICD-10-CM | POA: Insufficient documentation

## 2015-03-19 LAB — CBC
HEMATOCRIT: 38.3 % — AB (ref 40.0–52.0)
HEMOGLOBIN: 12.6 g/dL — AB (ref 13.0–18.0)
MCH: 29.1 pg (ref 26.0–34.0)
MCHC: 33 g/dL (ref 32.0–36.0)
MCV: 88.2 fL (ref 80.0–100.0)
Platelets: 121 10*3/uL — ABNORMAL LOW (ref 150–440)
RBC: 4.34 MIL/uL — ABNORMAL LOW (ref 4.40–5.90)
RDW: 16 % — ABNORMAL HIGH (ref 11.5–14.5)
WBC: 4.6 10*3/uL (ref 3.8–10.6)

## 2015-03-19 LAB — BASIC METABOLIC PANEL
ANION GAP: 7 (ref 5–15)
BUN: 10 mg/dL (ref 6–20)
CALCIUM: 8.6 mg/dL — AB (ref 8.9–10.3)
CO2: 32 mmol/L (ref 22–32)
CREATININE: 0.58 mg/dL — AB (ref 0.61–1.24)
Chloride: 102 mmol/L (ref 101–111)
GFR calc Af Amer: >60 mL/min (ref >60)
GLUCOSE: 95 mg/dL (ref 65–99)
Potassium: 3 mmol/L — ABNORMAL LOW (ref 3.5–5.1)
Sodium: 141 mmol/L (ref 135–145)

## 2015-03-19 LAB — POTASSIUM: POTASSIUM: 3 mmol/L — AB (ref 3.5–5.1)

## 2015-03-19 MED ORDER — DOXAZOSIN MESYLATE 1 MG PO TABS
1.0000 mg | ORAL_TABLET | Freq: Every day | ORAL | Status: DC
  Filled 2015-03-19: qty 1

## 2015-03-19 MED ORDER — POTASSIUM CHLORIDE 20 MEQ PO PACK
40.0000 meq | PACK | Freq: Once | ORAL | Status: AC
  Administered 2015-03-19: 40 meq via ORAL
  Filled 2015-03-19: qty 2

## 2015-03-19 MED ORDER — POTASSIUM CHLORIDE 20 MEQ PO PACK: 40.0000 meq | PACK | Freq: Every day | ORAL | Status: AC

## 2015-03-19 MED ORDER — CEPHALEXIN 250 MG/5ML PO SUSR: 500.0000 mg | Freq: Two times a day (BID) | ORAL | Status: DC

## 2015-03-19 MED ORDER — CEPHALEXIN 500 MG PO CAPS
500.0000 mg | ORAL_CAPSULE | Freq: Two times a day (BID) | ORAL | Status: DC
  Administered 2015-03-19: 500 mg via ORAL
  Filled 2015-03-19: qty 1

## 2015-03-19 MED ORDER — CEPHALEXIN 250 MG/5ML PO SUSR
500.0000 mg | Freq: Two times a day (BID) | ORAL | Status: DC
  Filled 2015-03-19 (×2): qty 10

## 2015-03-19 NOTE — Discharge Summary (Addendum)
[facility] [place] [facility]   PATIENT NAME: Levi Jordan    MR#:  [id number]  DATE OF BIRTH:  05/04/1931  DATE OF ADMISSION:  03/18/2015 ADMITTING PHYSICIAN: Alford Highlandichard Wieting, MD  DATE OF DISCHARGE: 03/19/15  PRIMARY CARE PHYSICIAN: Lauro RegulusANDERSON,MARSHALL W., MD    ADMISSION DIAGNOSIS:  Vomiting [R11.10]  DISCHARGE DIAGNOSIS:  Foreign body in the left vallecula likely pill -resolved UTI-recurrent  SECONDARY DIAGNOSIS:   Past Medical History  Diagnosis Date  . Parkinson disease (HCC)   . Dementia     HOSPITAL COURSE:   Levi Jordan is a 81 y.o. male with a known history of Parkinson's disease and dementia. Today after medications he was eating and he felt something get caught in his throat. He follows with hospice at home. He was brought into the ER for further evaluation. Initially the patient was gurgling on secretions  1. Foreign body in vallecula likely a pill. - this  Likely dissolvedand pass on its own. -seen by ENT and recommended speech therapy who saw patient and recommended a dysphagia 1 diet -Patient with well denies any foreign body like sensation in his throat  2. Parkinson's disease- continue Sinemet  3. Hypokalemia--Potassium improving we'll check one level prior to discharge  4. Decubitus ulcer on buttock will get wound care consultation appreciated  5. Chronic Foley catheter with positive urine analysis. Foley catheter changed in the emergency room. No need for antibiotics since the patient is not septic. Changing the catheter will be all that is needed. -patient will continue his Cipro for prophylaxis. -UC so far negative  6. Thrombocytopenia- looking back at old labs this is not new  7. Patient followed by hospice.pt is DNR  Overall stable. D/c home with hospice CONSULTS OBTAINED:  Treatment Team:  Geanie LoganPaul Bennett, MD  DRUG ALLERGIES:  No Known Allergies  DISCHARGE MEDICATIONS:   Current Discharge Medication  List    START taking these medications   Details  cephALEXin (KEFLEX) 250 MG/5ML suspension Take 10 mLs (500 mg total) by mouth every 12 (twelve) hours. Qty: 100 mL, Refills: 0      CONTINUE these medications which have NOT CHANGED   Details  carbidopa-levodopa (PARCOPA) 25-100 MG disintegrating tablet Take 1.5 tablets by mouth 3 (three) times daily.    ciprofloxacin (CIPRO) 500 MG tablet Take 500 mg by mouth 2 (two) times daily.    silodosin (RAPAFLO) 8 MG CAPS capsule Take 8 mg by mouth every evening.     vitamin B-12 (CYANOCOBALAMIN) 500 MCG tablet Take 500 mcg by mouth daily.         If you experience worsening of your admission symptoms, develop shortness of breath, life threatening emergency, suicidal or homicidal thoughts you must seek medical attention immediately by calling 911 or calling your MD immediately  if symptoms less severe.  You Must read complete instructions/literature along with all the possible adverse reactions/side effects for all the Medicines you take and that have been prescribed to you. Take any new Medicines after you have completely understood and accept all the possible adverse reactions/side effects.   Please note  You were cared for by a hospitalist during your hospital stay. If you have any questions about your discharge medications or the care you received while you were in the hospital after you are discharged, you can call the unit and asked to speak with the hospitalist on call if the hospitalist that took care of you is not available. Once you are discharged, your  primary care physician will handle any further medical issues. Please note that NO REFILLS for any discharge medications will be authorized once you are discharged, as it is imperative that you return to your primary care physician (or establish a relationship with a primary care physician if you do not have one) for your aftercare needs so that they can reassess your need for medications  and monitor your lab values. Today   SUBJECTIVE   Doing ok  VITAL SIGNS:  Blood pressure 92/48, pulse 68, temperature 97.8 F (36.6 C), temperature source Oral, resp. rate 17, height 5\' 10"  (1.778 m), weight 132 lb 8 oz (60.102 kg), SpO2 98 %.  I/O:   Intake/Output Summary (Last 24 hours) at 03/19/15 1432 Last data filed at 03/19/15 0521  Gross per 24 hour  Intake 525.83 ml  Output    200 ml  Net 325.83 ml    PHYSICAL EXAMINATION:  GENERAL:  81 y.o.-year-old patient lying in the bed with no acute distress.  EYES: Pupils equal, round, reactive to light and accommodation. No scleral icterus. Extraocular muscles intact.  HEENT: Head atraumatic, normocephalic. Oropharynx and nasopharynx clear.  NECK:  Supple, no jugular venous distention. No thyroid enlargement, no tenderness.  LUNGS: Normal breath sounds bilaterally, no wheezing, rales,rhonchi or crepitation. No use of accessory muscles of respiration.  CARDIOVASCULAR: S1, S2 normal. No murmurs, rubs, or gallops.  ABDOMEN: Soft, non-tender, non-distended. Bowel sounds present. No organomegaly or mass. Chronic Foley EXTREMITIES: No pedal edema, cyanosis, or clubbing.  NEUROLOGIC: grossly intact. Patient has Parkinson's not able to participate in neuro exam SKIN: No obvious rash, lesion, or ulcer.   DATA REVIEW:   CBC   Recent Labs Lab 03/19/15 0600  WBC 4.6  HGB 12.6*  HCT 38.3*  PLT 121*    Chemistries   Recent Labs Lab 03/18/15 1639 03/19/15 0600  NA 141 141  K 2.8* 3.0*  CL 101 102  CO2 34* 32  GLUCOSE 113* 95  BUN 11 10  CREATININE 0.65 0.58*  CALCIUM 9.1 8.6*  MG 2.1  --   AST 16  --   ALT <5*  --   ALKPHOS 26*  --   BILITOT 0.6  --     Microbiology Results   Recent Results (from the past 240 hour(s))  Urine culture     Status: None (Preliminary result)   Collection Time: 03/18/15  4:39 PM  Result Value Ref Range Status   Specimen Description URINE, RANDOM  Final   Special Requests NONE   Final   Culture NO GROWTH < 12 HOURS  Final   Report Status PENDING  Incomplete    RADIOLOGY:  Dg Neck Soft Tissue  03/18/2015  CLINICAL DATA:  Difficulty swallowing during lunch. Lucila Maine states that he has had difficulty swallowing before but this time he even had trouble controlling his saliva and was drooling. Pt told grandson, "I'm getting ready to leave you." Pt is under hospice care for Parkinson's Disease. Pt has swollen and contracted hands. The contractions have been present for approx 2 months, as the swelling for approx 2 weeks. Pt has foley catheter in place due to bedsores (to allow them to dry out and heal better). Foley has been in place about a week, and has UTI since Friday EXAM: NECK SOFT TISSUES - 1+ VIEW COMPARISON:  None. FINDINGS: 11 mm oval density projects in the region of the valleculae. There is no evidence of retropharyngeal soft tissue swelling or epiglottic enlargement. The cervical airway  is unremarkable . Degenerative disc disease with small anterior endplate spurs C3-T1. Left carotid bifurcation calcified plaque. IMPRESSION: 1. Possible 11 mm nonocclusive foreign body at the level of the valleculae. Electronically Signed   By: Corlis Leak M.D.   On: 03/18/2015 16:38   Dg Chest 2 View  03/18/2015  CLINICAL DATA:  Difficulty swallowing, history of Parkinson's EXAM: CHEST - 2 VIEW COMPARISON:  02/03/2015 FINDINGS: Cardiac shadow is at the upper limits of normal in size. Aortic calcifications are again seen. The lungs are clear bilaterally. Scattered large and small bowel gas is noted below the diaphragm. This may represent a component of ileus. No other focal abnormality is seen. IMPRESSION: No acute intrathoracic abnormality. Gas-filled loops of small bowel in the upper abdomen. This suggests an underlying ileus. Electronically Signed   By: Alcide Clever M.D.   On: 03/18/2015 16:27   Ct Soft Tissue Neck Wo Contrast  03/18/2015  CLINICAL DATA:  Possible foreign body in  throat. Parkinson's disease. Hospice care. EXAM: CT NECK WITHOUT CONTRAST TECHNIQUE: Multidetector CT imaging of the neck was performed following the standard protocol without intravenous contrast. COMPARISON:  Plain film of earlier today. FINDINGS: Pharynx and larynx: Normal nasopharynx. Radiopaque foreign object at the level of the left side of the valleculae measures 8 mm on image 56/series 2. Normal larynx. Salivary glands: Diminutive but symmetric submandibular and parotid glands. Thyroid: Within normal limits. Lymph nodes: No adenopathy. Evaluation limited by lack of IV contrast and paucity of fat. Vascular: Bilateral carotid atherosclerosis. Limited intracranial: Unremarkable Visualized orbits: Normal in appearance. Mastoids and visualized paranasal sinuses: Clear mastoid air cells. Partial opacification of the right maxillary sinus. Hyperostosis frontalis incidentally noted. Multiple periapical lucencies within the maxilla. Skeleton: Cervical spondylosis. Upper chest: Aortic and branch vessel atherosclerosis. Clear lung apices. Anasarca. IMPRESSION: 1. 8 mm radiopaque foreign object positioned within the left vallecula. 2. Atherosclerosis. 3. Sinus disease. Electronically Signed   By: Jeronimo Greaves M.D.   On: 03/18/2015 18:36   Dg Abd 2 Views  03/18/2015  CLINICAL DATA:  Difficulty swallowing, history of Parkinson's disease EXAM: ABDOMEN - 2 VIEW COMPARISON:  10/26/2013 FINDINGS: Scattered large and small bowel gas is noted. No obstructive changes are seen. These changes may represent underlying ileus. No free air is noted. Degenerative changes of the lumbar spine are seen. IMPRESSION: Changes which may represent a generalized ileus. Correlation with physical exam is recommended. No obstructive changes are seen. No free air is noted. Electronically Signed   By: Alcide Clever M.D.   On: 03/18/2015 17:08     Management plans discussed with the patient, family and they are in agreement.  CODE STATUS:      Code Status Orders        Start     Ordered   03/18/15 2143  Do not attempt resuscitation (DNR)   Continuous    Question Answer Comment  In the event of cardiac or respiratory ARREST Do not call a "code blue"   In the event of cardiac or respiratory ARREST Do not perform Intubation, CPR, defibrillation or ACLS   In the event of cardiac or respiratory ARREST Use medication by any route, position, wound care, and other measures to relive pain and suffering. May use oxygen, suction and manual treatment of airway obstruction as needed for comfort.   Comments nurse may pronounce      03/18/15 2143    Advance Directive Documentation        Most Recent Value  Type of Advance Directive  Healthcare Power of Attorney, Living will   Pre-existing out of facility DNR order (yellow form or pink MOST form)     "MOST" Form in Place?        TOTAL TIME TAKING CARE OF THIS PATIENT: *40 minutes.    Commodore Bellew M.D on 03/19/2015 at 2:32 PM  Between 7am to 6pm - Pager - [phone] After 6pm go to [email or web address] - password [email or web address] [facility]  [facility]  Office  [phone]  CC: Primary care physician; Lauro Regulus., MD

## 2015-03-19 NOTE — Consult Note (Signed)
WOC wound consult note Reason for Consult:Stage 3 pressure injury to coccyx.  Moisture Associated Skin Damage (MASD) to periwound.  Incontinent of bowel and bladder.  Foley in place .  Loose stool is cleaned from patient today during visit.  Wound type: stage 3 pressure injury to coccyx, periwound macerated with MASD from incontinence Pressure Ulcer POA: Yes Measurement:1 cm x 0.5 cm x 0.2 cm Wound bed:100% pale pink  Drainage (amount, consistency, odor) Minimal serosanguinous drainage.  No odor.  Periwound:Maceration, extending 2 cm to periwound from incontinence Dressing procedure/placement/frequency:Cleanse sacrococcyxgeal area with NS and pat gently dry.  Apply Allevyn silicone border foam dressing. Change every 2 days and PRN soilage.  No disposable briefs under patient.  Dermatherapy therapeutic linen will absorb moisture and promote healing to skin.  Turn and reposition every 2 hours.  Will not follow at this time.  Please re-consult if needed.  Maple HudsonKaren Carmelia Tiner RN BSN CWON Pager [phone]

## 2015-03-19 NOTE — Consult Note (Signed)
PHARMACIST - PHYSICIAN - HEALTHCARE TEAM  COMMUNICATION  CONCERNING:  Crushable Medications  . Current facility-administered medications:  .  0.9 % NaCl with KCl 20 mEq/ L  infusion, , Intravenous, Continuous, Alford Highlandichard Wieting, MD, Last Rate: 50 mL/hr at 03/18/15 2317 .  acetaminophen (TYLENOL) tablet 650 mg, 650 mg, Oral, Q6H PRN, 650 mg at 03/18/15 2316 **OR** acetaminophen (TYLENOL) suppository 650 mg, 650 mg, Rectal, Q6H PRN, Alford Highlandichard Wieting, MD .  carbidopa-levodopa (SINEMET IR) 25-100 MG per tablet immediate release 1.5 tablet, 1.5 tablet, Oral, TID, Alford Highlandichard Wieting, MD, 1.5 tablet at 03/18/15 2308 .  cyanocobalamin tablet 500 mcg, 500 mcg, Oral, Daily, Alford Highlandichard Wieting, MD .  enoxaparin (LOVENOX) injection 40 mg, 40 mg, Subcutaneous, Q24H, Alford Highlandichard Wieting, MD, 40 mg at 03/18/15 2308 .  tamsulosin (FLOMAX) capsule 0.4 mg, 0.4 mg, Oral, QPC breakfast, Enedina FinnerSona Patel, MD   RECOMMENDATION: Pharmacy has reviewed patients medications and found that tamsulosin 0.4mg  capsule can NOT be crushed or chewed. Must be swallowed whole.  This medication may be replaced  with doxazosin 1mg  while inpatient.   Patient's outpatient medication was Rapaflo (Silodosin). This medication can be opened and sprinkled on applesauce and swallowed without chewing within 5 minutes of opening. Followed by 8 ounces of cool water.   Cher NakaiSheema Chakia Counts, PharmD Pharmacy Resident 03/19/2015 11:52 AM

## 2015-03-19 NOTE — Plan of Care (Signed)
RN notified pharm to assess crushability of ALL meds to administer - based on Hospice suggestion.  Awaiting their analysis before giving meds.

## 2015-03-19 NOTE — Care Management Note (Signed)
Case Management Note  Patient Details  Name: Levi Jordan MRN: [name] Date of Birth: 05/09/1931  Subjective/Objective:  Notified [name] with [facility] of discharge                  Action/Plan:   Expected Discharge Date:                  Expected Discharge Plan:     In-House Referral:     Discharge planning Services     Post Acute Care Choice:    Choice offered to:     DME Arranged:    DME Agency:     HH Arranged:    HH Agency:     Status of Service:     Medicare Important Message Given:    Date Medicare IM Given:    Medicare IM give by:    Date Additional Medicare IM Given:    Additional Medicare Important Message give by:     If discussed at Long Length of Stay Meetings, dates discussed:    Additional Comments:  Marily MemosLisa M Dimarco Minkin, RN 03/19/2015, 3:25 PM

## 2015-03-19 NOTE — Discharge Planning (Signed)
Dr. Luberta RobertsonPaged with stat potassium lab results per her request. (3.0)

## 2015-03-19 NOTE — Evaluation (Signed)
Clinical/Bedside Swallow Evaluation Patient Details  Name: Levi Jordan MRN: [id number] Date of Birth: [birth year]  Today's Date: 03/19/2015 Time: SLP Start Time (ACUTE ONLY): 0900 SLP Stop Time (ACUTE ONLY): 1000 SLP Time Calculation (min) (ACUTE ONLY): 60 min  Past Medical History:  Past Medical History  Diagnosis Date  . Parkinson disease (HCC)   . Dementia    Past Surgical History:  Past Surgical History  Procedure Laterality Date  . Joint replacement     HPI:  Pt is a 81 y.o. male with a known history of Parkinson's disease and dementia. Today after medications he was eating and he felt something get caught in his throat. He follows with hospice at home. He was brought into the ER for further evaluation. Initially the patient was gurgling on secretions. The patient was given IV glucagon and then felt a little bit better afterwards. He had a CT scan done of the neck which showed a 8 mm radiopaque object in the left vallecula. When I saw the patient, he was not gurgling on secretions and he is feeling a little bit better but still complaining of tightness in his throat and chest. Pt is awake and taking part in the eval stating he liked certain foods including ice cream. Low volume of speech noted; min. increased oral-lingual movements.    Assessment / Plan / Recommendation Clinical Impression  Pt appeared to adequately and safely tolerate trials of thin liquids and purees w/ no overt s/s of aspiration noted; no significant increased oral phase time or deficits noted during trials. Pt appeared to orally manage and clear w/ single swallows trials of these consistencies. Pt exhibited min. increased oral phase time and lingual activity post swallowing w/ trials of increased texture. Pt required full feeding assistance w/ po's - pt tolerated single sips via straw appropriately to lessen need for pouring liquids into his mouth via cup(suspect this could be an increased risk for aspiration for  pt as he is dependent on the feeder). Rec. a modified diet of Dys. 1 (puree) w/ thin liquids w/ aspiration precautions; meds in Puree(crushed as able). Feeding assistance at meals. ST will f/u if any change in status and education w/ family when present. NSG updated.     Aspiration Risk  Mild aspiration risk    Diet Recommendation  Dys. 1 (puree); thin liquids; strict aspiration precautions when feeding pt  Medication Administration: Crushed with puree    Other  Recommendations Recommended Consults:  (Dietician consult) Oral Care Recommendations: Oral care BID;Staff/trained caregiver to provide oral care   Follow up Recommendations   (TBD)    Frequency and Duration min 2x/week  1 week       Prognosis Prognosis for Safe Diet Advancement: Fair Barriers to Reach Goals: Cognitive deficits;Severity of deficits Barriers/Prognosis Comment: Parkinson's Dis      Swallow Study   General Date of Onset: 03/18/15 HPI: Pt is a 81 y.o. male with a known history of Parkinson's disease and dementia. Today after medications he was eating and he felt something get caught in his throat. He follows with hospice at home. He was brought into the ER for further evaluation. Initially the patient was gurgling on secretions. The patient was given IV glucagon and then felt a little bit better afterwards. He had a CT scan done of the neck which showed a 8 mm radiopaque object in the left vallecula. When I saw the patient, he was not gurgling on secretions and he is feeling a little bit better  but still complaining of tightness in his throat and chest. Pt is awake and taking part in the eval stating he liked certain foods including ice cream. Low volume of speech noted; min. increased oral-lingual movements.  Type of Study: Bedside Swallow Evaluation Previous Swallow Assessment: none noted Diet Prior to this Study: Thin liquids ("soft foods") Temperature Spikes Noted: No (wbc 4.6) Respiratory Status: Room  air History of Recent Intubation: No Behavior/Cognition: Alert;Cooperative;Pleasant mood;Confused;Requires cueing Oral Cavity Assessment: Within Functional Limits Oral Care Completed by SLP: Yes Oral Cavity - Dentition: Edentulous (dentures are "too loose") Vision:  (does not feed himself) Self-Feeding Abilities: Total assist Patient Positioning: Upright in bed Baseline Vocal Quality: Low vocal intensity Volitional Cough: Weak (min. ) Volitional Swallow: Able to elicit    Oral/Motor/Sensory Function Overall Oral Motor/Sensory Function: Within functional limits (grossly)   Ice Chips Ice chips: Within functional limits Presentation: Spoon (fed; 3 trials)   Thin Liquid Thin Liquid: Within functional limits Presentation: Cup;Straw (fed; 5 trials w/ each) Other Comments: do not rec. pouring liquids in mouth via cup if able to use the straw safely - more control on the pt's part vs being fed his liquids    Nectar Thick Nectar Thick Liquid: Not tested   Honey Thick Honey Thick Liquid: Not tested   Puree Puree: Within functional limits Presentation: Spoon (fed; ~4 ozs total)   Solid Solid: Impaired Presentation:  (fed; 1 trial) Oral Phase Impairments: Impaired mastication (decreased awareness taking bolus when presented) Oral Phase Functional Implications:  (increased oral phase time) Pharyngeal Phase Impairments:  (none) Other Comments: pt exhibited min increased oral phase time w/ lingual manipulation and clearing followed by increased lingual activity post swallowing.       Jerilynn SomKatherine Watson, MS, CCC-SLP  Watson,Katherine 03/19/2015,2:21 PM

## 2015-03-19 NOTE — Discharge Instructions (Signed)
Crush meds and given in apple sauce  Pureed diet with thin liquid

## 2015-03-19 NOTE — Progress Notes (Signed)
Visit made. Patient seen sitting up in bed, alert and oriented able to converse, denied pain. He stated he felt :like the pill was gone" from his throat. He also reported that he ate jello and applesauce for breakfast. Per chart review he ate bites. He is currently receiving IV fluids and his home medications. His foley was replaced in the ED last evening. He has a wound consult and ENT consult pending. Speech therapist in during visit, and will recommend a pureed diet with medications crushed in applesauce/pudding/ice cream. Staff RN matthew present and made aware. He will contact pharmacy to confirm appropriateness of crushing medications.  No family present at time of visit. Writer was questioned by CSW Jetta LoutBailey Morgan regarding patient having a legal guardian, per conversation with hospice team this is not something Hospice is aware of. Will continue to follow and update hospice team.  Dayna BarkerKaren Robertson Rn, BSN, [facility], [facility] L[facility] [phone] c

## 2015-03-19 NOTE — Consult Note (Signed)
Pacer, Dorn [id number] [birth year] Levi Nearing, MD  Reason for Consult: Pill caught in throat Requesting Physician: [name], MD Consulting Physician: Levi Jordan  HPI: This 81 y.o. year old male was admitted on 03/18/2015 for Vomiting [R11.10]. The patient has a history of Parkinson's disease with dementia and had problems yesterday after attempting to swallow a pill. CT scan showed an 8 mm radio opaque object consistent with a pill lodged in the vallecula. The patient says he can now swallow better and has been drinking orange juice and eating ice cream. He denies significant difficulty swallowing at home, and says he is usually able to eat meats, however the reliability of his history is uncertain given his dementia.  Allergies: No Known Allergies  Medications:  Medications Prior to Admission  Medication Sig Dispense Refill  . carbidopa-levodopa (PARCOPA) 25-100 MG disintegrating tablet Take 1.5 tablets by mouth 3 (three) times daily.    . ciprofloxacin (CIPRO) 500 MG tablet Take 500 mg by mouth 2 (two) times daily.    . silodosin (RAPAFLO) 8 MG CAPS capsule Take 8 mg by mouth every evening.     . vitamin B-12 (CYANOCOBALAMIN) 500 MCG tablet Take 500 mcg by mouth daily.     .  Current Facility-Administered Medications  Medication Dose Route Frequency Provider Last Rate Last Dose  . 0.9 % NaCl with KCl 20 mEq/ L  infusion   Intravenous Continuous Loletha Grayer, MD 50 mL/hr at 03/18/15 2317    . acetaminophen (TYLENOL) tablet 650 mg  650 mg Oral Q6H PRN Loletha Grayer, MD   650 mg at 03/18/15 2316   Or  . acetaminophen (TYLENOL) suppository 650 mg  650 mg Rectal Q6H PRN Loletha Grayer, MD      . carbidopa-levodopa (SINEMET IR) 25-100 MG per tablet immediate release 1.5 tablet  1.5 tablet Oral TID Loletha Grayer, MD   1.5 tablet at 03/18/15 2308  . cyanocobalamin tablet 500 mcg  500 mcg Oral Daily Loletha Grayer, MD      . enoxaparin (LOVENOX) injection 40 mg  40 mg  Subcutaneous Q24H Loletha Grayer, MD   40 mg at 03/18/15 2308  . tamsulosin (FLOMAX) capsule 0.4 mg  0.4 mg Oral QPC breakfast [name], MD        PMH:  Past Medical History  Diagnosis Date  . Parkinson disease ([place])   . Dementia     Fam Hx:  Family History  Problem Relation Age of Onset  . Stroke Mother     Soc Hx:  Social History   Social History  . Marital Status: Married    Spouse Name: N/A  . Number of Children: N/A  . Years of Education: N/A   Occupational History  . Not on file.   Social History Main Topics  . Smoking status: Former Research scientist (life sciences)  . Smokeless tobacco: Not on file  . Alcohol Use: No  . Drug Use: No  . Sexual Activity: Not on file   Other Topics Concern  . Not on file   Social History Narrative    PSH:  Past Surgical History  Procedure Laterality Date  . Joint replacement    . Procedures since admission: No admission procedures for hospital encounter.  ROS: Review of systems normal other than 12 systems except per HPI.  PHYSICAL EXAM Vitals:  Filed Vitals:   03/19/15 1124 03/19/15 1130  BP: 81/49 92/48  Pulse: 68   Temp: 97.8 F (36.6 C)   Resp: 17   .  General: Well-developed, Well-nourished in no acute distress Mood: Mood and affect well adjusted, pleasant and cooperative. Orientation: Grossly alert and oriented. Vocal Quality: No hoarseness. Communicates verbally. head and Face: NCAT. No facial asymmetry. No visible skin lesions. No significant facial scars. No tenderness with sinus percussion. Facial strength normal and symmetric. Ears: External ears with normal landmarks, no lesions. External auditory canals free of infection, cerumen impaction or lesions. Tympanic membranes intact with good landmarks and normal mobility on pneumatic otoscopy. No middle ear effusion. Hearing: Speech reception grossly normal. Nose: External nose normal with midline dorsum and no lesions or deformity. Nasal Cavity reveals essentially midline  septum with normal inferior turbinates. No significant mucosal congestion or erythema. Nasal secretions are minimal and clear. No polyps seen on anterior rhinoscopy. Oral Cavity/ Oropharynx: Lips are normal with no lesions. Teeth no frank dental caries. Gingiva healthy with no lesions or gingivitis. Oropharynx including tongue, buccal mucosa, floor of mouth, hard and soft palate, uvula and posterior pharynx free of exudates, erythema or lesions with normal symmetry and hydration.  Indirect Laryngoscopy/Nasopharyngoscopy: Visualization of the larynx, hypopharynx and nasopharynx is not possible in this setting with routine examination. Neck: Supple and symmetric with no palpable masses, tenderness or crepitance. The trachea is midline. Thyroid gland is soft, nontender and symmetric with no masses or enlargement. Parotid and submandibular glands are soft, nontender and symmetric, without masses. Lymphatic: Cervical lymph nodes are without palpable lymphadenopathy or tenderness. Respiratory: Normal respiratory effort without labored breathing. Cardiovascular: Carotid pulse shows regular rate and rhythm Neurologic: Cranial Nerves II through XII are grossly intact. Eyes: Gaze and Ocular Motility are grossly normal. PERRLA. No visible nystagmus.  MEDICAL DECISION MAKING: Data Review:  Results for orders placed or performed during the hospital encounter of 03/18/15 (from the past 48 hour(s))  CBC with Differential/Platelet     Status: Abnormal   Collection Time: 03/18/15  4:39 PM  Result Value Ref Range   WBC 3.4 (L) 3.8 - 10.6 K/uL   RBC 4.61 4.40 - 5.90 MIL/uL   Hemoglobin 13.7 13.0 - 18.0 g/dL   HCT 40.6 40.0 - 52.0 %   MCV 87.9 80.0 - 100.0 fL   MCH 29.6 26.0 - 34.0 pg   MCHC 33.7 32.0 - 36.0 g/dL   RDW 16.5 (H) 11.5 - 14.5 %   Platelets 125 (L) 150 - 440 K/uL   Neutrophils Relative % 51 %   Neutro Abs 1.8 1.4 - 6.5 K/uL   Lymphocytes Relative 37 %   Lymphs Abs 1.2 1.0 - 3.6 K/uL   Monocytes  Relative 8 %   Monocytes Absolute 0.3 0.2 - 1.0 K/uL   Eosinophils Relative 3 %   Eosinophils Absolute 0.1 0 - 0.7 K/uL   Basophils Relative 1 %   Basophils Absolute 0.0 0 - 0.1 K/uL  Comprehensive metabolic panel     Status: Abnormal   Collection Time: 03/18/15  4:39 PM  Result Value Ref Range   Sodium 141 135 - 145 mmol/L   Potassium 2.8 (LL) 3.5 - 5.1 mmol/L    Comment: CRITICAL RESULT CALLED TO, READ BACK BY AND VERIFIED WITH  MARY NEEDHAM AT 1726 03/18/15 SDR    Chloride 101 101 - 111 mmol/L   CO2 34 (H) 22 - 32 mmol/L   Glucose, Bld 113 (H) 65 - 99 mg/dL   BUN 11 6 - 20 mg/dL   Creatinine, Ser 0.65 0.61 - 1.24 mg/dL   Calcium 9.1 8.9 - 10.3 mg/dL   Total Protein  6.9 6.5 - 8.1 g/dL   Albumin 3.4 (L) 3.5 - 5.0 g/dL   AST 16 15 - 41 U/L   ALT <5 (L) 17 - 63 U/L   Alkaline Phosphatase 26 (L) 38 - 126 U/L   Total Bilirubin 0.6 0.3 - 1.2 mg/dL   GFR calc non Af Amer >60 >60 mL/min   GFR calc Af Amer >60 >60 mL/min    Comment: (NOTE) The eGFR has been calculated using the CKD EPI equation. This calculation has not been validated in all clinical situations. eGFR's persistently <60 mL/min signify possible Chronic Kidney Disease.    Anion gap 6 5 - 15  Urinalysis complete, with microscopic ([facility] only)     Status: Abnormal   Collection Time: 03/18/15  4:39 PM  Result Value Ref Range   Color, Urine AMBER (A) YELLOW   APPearance CLOUDY (A) CLEAR   Glucose, UA NEGATIVE NEGATIVE mg/dL   Bilirubin Urine NEGATIVE NEGATIVE   Ketones, ur TRACE (A) NEGATIVE mg/dL   Specific Gravity, Urine 1.024 1.005 - 1.030   Hgb urine dipstick 3+ (A) NEGATIVE   pH 6.0 5.0 - 8.0   Protein, ur 100 (A) NEGATIVE mg/dL   Nitrite NEGATIVE NEGATIVE   Leukocytes, UA 2+ (A) NEGATIVE   RBC / HPF TOO NUMEROUS TO COUNT 0 - 5 RBC/hpf   WBC, UA TOO NUMEROUS TO COUNT 0 - 5 WBC/hpf   Bacteria, UA NONE SEEN NONE SEEN   Squamous Epithelial / LPF NONE SEEN NONE SEEN   Mucous PRESENT    Hyaline Casts, UA  PRESENT    Ca Oxalate Crys, UA PRESENT   Urine culture     Status: None (Preliminary result)   Collection Time: 03/18/15  4:39 PM  Result Value Ref Range   Specimen Description URINE, RANDOM    Special Requests NONE    Culture NO GROWTH < 12 HOURS    Report Status PENDING   Magnesium     Status: None   Collection Time: 03/18/15  4:39 PM  Result Value Ref Range   Magnesium 2.1 1.7 - 2.4 mg/dL  Basic metabolic panel     Status: Abnormal   Collection Time: 03/19/15  6:00 AM  Result Value Ref Range   Sodium 141 135 - 145 mmol/L   Potassium 3.0 (L) 3.5 - 5.1 mmol/L   Chloride 102 101 - 111 mmol/L   CO2 32 22 - 32 mmol/L   Glucose, Bld 95 65 - 99 mg/dL   BUN 10 6 - 20 mg/dL   Creatinine, Ser 0.58 (L) 0.61 - 1.24 mg/dL   Calcium 8.6 (L) 8.9 - 10.3 mg/dL   GFR calc non Af Amer >60 >60 mL/min   GFR calc Af Amer >60 >60 mL/min    Comment: (NOTE) The eGFR has been calculated using the CKD EPI equation. This calculation has not been validated in all clinical situations. eGFR's persistently <60 mL/min signify possible Chronic Kidney Disease.    Anion gap 7 5 - 15  CBC     Status: Abnormal   Collection Time: 03/19/15  6:00 AM  Result Value Ref Range   WBC 4.6 3.8 - 10.6 K/uL   RBC 4.34 (L) 4.40 - 5.90 MIL/uL   Hemoglobin 12.6 (L) 13.0 - 18.0 g/dL   HCT 38.3 (L) 40.0 - 52.0 %   MCV 88.2 80.0 - 100.0 fL   MCH 29.1 26.0 - 34.0 pg   MCHC 33.0 32.0 - 36.0 g/dL   RDW 16.0 (H)  11.5 - 14.5 %   Platelets 121 (L) 150 - 440 K/uL  . Dg Neck Soft Tissue  03/18/2015  CLINICAL DATA:  Difficulty swallowing during lunch. Yolanda Bonine states that he has had difficulty swallowing before but this time he even had trouble controlling his saliva and was drooling. Pt told grandson, "I'm getting ready to leave you." Pt is under hospice care for Parkinson's Disease. Pt has swollen and contracted hands. The contractions have been present for approx 2 months, as the swelling for approx 2 weeks. Pt has foley  catheter in place due to bedsores (to allow them to dry out and heal better). Foley has been in place about a week, and has UTI since Friday EXAM: NECK SOFT TISSUES - 1+ VIEW COMPARISON:  None. FINDINGS: 11 mm oval density projects in the region of the valleculae. There is no evidence of retropharyngeal soft tissue swelling or epiglottic enlargement. The cervical airway is unremarkable . Degenerative disc disease with small anterior endplate spurs Z0-C5. Left carotid bifurcation calcified plaque. IMPRESSION: 1. Possible 11 mm nonocclusive foreign body at the level of the valleculae. Electronically Signed   By: Lucrezia Europe M.D.   On: 03/18/2015 16:38   Dg Chest 2 View  03/18/2015  CLINICAL DATA:  Difficulty swallowing, history of Parkinson's EXAM: CHEST - 2 VIEW COMPARISON:  02/03/2015 FINDINGS: Cardiac shadow is at the upper limits of normal in size. Aortic calcifications are again seen. The lungs are clear bilaterally. Scattered large and small bowel gas is noted below the diaphragm. This may represent a component of ileus. No other focal abnormality is seen. IMPRESSION: No acute intrathoracic abnormality. Gas-filled loops of small bowel in the upper abdomen. This suggests an underlying ileus. Electronically Signed   By: Inez Catalina M.D.   On: 03/18/2015 16:27   Ct Soft Tissue Neck Wo Contrast  03/18/2015  CLINICAL DATA:  Possible foreign body in throat. Parkinson's disease. Hospice care. EXAM: CT NECK WITHOUT CONTRAST TECHNIQUE: Multidetector CT imaging of the neck was performed following the standard protocol without intravenous contrast. COMPARISON:  Plain film of earlier today. FINDINGS: Pharynx and larynx: Normal nasopharynx. Radiopaque foreign object at the level of the left side of the valleculae measures 8 mm on image 56/series 2. Normal larynx. Salivary glands: Diminutive but symmetric submandibular and parotid glands. Thyroid: Within normal limits. Lymph nodes: No adenopathy. Evaluation limited by  lack of IV contrast and paucity of fat. Vascular: Bilateral carotid atherosclerosis. Limited intracranial: Unremarkable Visualized orbits: Normal in appearance. Mastoids and visualized paranasal sinuses: Clear mastoid air cells. Partial opacification of the right maxillary sinus. Hyperostosis frontalis incidentally noted. Multiple periapical lucencies within the maxilla. Skeleton: Cervical spondylosis. Upper chest: Aortic and branch vessel atherosclerosis. Clear lung apices. Anasarca. IMPRESSION: 1. 8 mm radiopaque foreign object positioned within the left vallecula. 2. Atherosclerosis. 3. Sinus disease. Electronically Signed   By: Abigail Miyamoto M.D.   On: 03/18/2015 18:36   Dg Abd 2 Views  03/18/2015  CLINICAL DATA:  Difficulty swallowing, history of Parkinson's disease EXAM: ABDOMEN - 2 VIEW COMPARISON:  10/26/2013 FINDINGS: Scattered large and small bowel gas is noted. No obstructive changes are seen. These changes may represent underlying ileus. No free air is noted. Degenerative changes of the lumbar spine are seen. IMPRESSION: Changes which may represent a generalized ileus. Correlation with physical exam is recommended. No obstructive changes are seen. No free air is noted. Electronically Signed   By: Inez Catalina M.D.   On: 03/18/2015 17:08    PROCEDURE:  Procedure: Diagnostic Fiberoptic Nasolaryngoscopy Diagnosis: Dysphagia, possible pill lodged in vallecula Indications: Evaluate throat due to swallowing difficulties and possible foreign body Findings: There is some mild edema and irritation of the mucosa in the vallecula where the pill was originally lodged, but there is no retained foreign body or pooling of secretions. The vocal cords are clear and mobile. There is no obstructing mass. Description of Procedure: After discussing procedure and risks  (primarily nose bleed) with the patient, the nose was anesthetized with topical Lidocaine 4% and decongested with phenylephrine. A flexible  fiberoptic scope was passed through the nasal cavity. The nasal cavity was inspected and the scope passed through the Nasopharynx to the region of the hypopharynx and larynx. The patient was instructed to phonate to assess vocal cord mobility. The tongue was extended to evaluate the tongue base completely. Valsalva was performed to insufflate the hypopharynx for improved examination. Findings are as noted above. The scope was withdrawn. The patient tolerated the procedure well.  ASSESSMENT: Patient with Parkinson's disease with recent dysphagia when attempting to swallow a pill. The pill his past at this point, just causing some mild inflammation in the region of the vallecula.  PLAN: Consider a modified barium swallow to evaluate the patient's swallowing efficiency given his known neurologic disorder. There is no evidence of retained foreign body at this point however, and his diet may be advanced as recommended by the swallowing therapist. Certainly start with soft foods and liquids, with thickened liquids if there is any concern of aspiration on the modified barium swallow.   Levi Nearing, MD 03/19/2015 11:54 AM

## 2015-03-19 NOTE — Discharge Planning (Signed)
Pt IV removed.  Pt DC paper given, explained and educated.  Told of suggested FU appts and also that scripts were sent to pharm.  VSS and RN assessment indicated stability for DC to home.  EMS contacted per pt family request to safely transport pt home and get into home.

## 2015-03-20 LAB — URINE CULTURE: Culture: NO GROWTH

## 2015-05-07 DEATH — deceased

## 2016-05-29 IMAGING — CT CT NECK W/O CM
4 of 5 series · 15 of 33 positions shown, 17 images · non-contrast
Comparison: Plain film of earlier today.

CLINICAL DATA: Possible foreign body in throat. Parkinson's
disease. Hospice care.

EXAM:
CT NECK WITHOUT CONTRAST
TECHNIQUE: Multidetector CT imaging of the neck was performed following the
standard protocol without intravenous contrast.

[Series 2: axial neck wo · axial · 0.55mm/px · z∈[-180,-64]mm · 3 of 116 slices shown]
[im 29/116  bone]
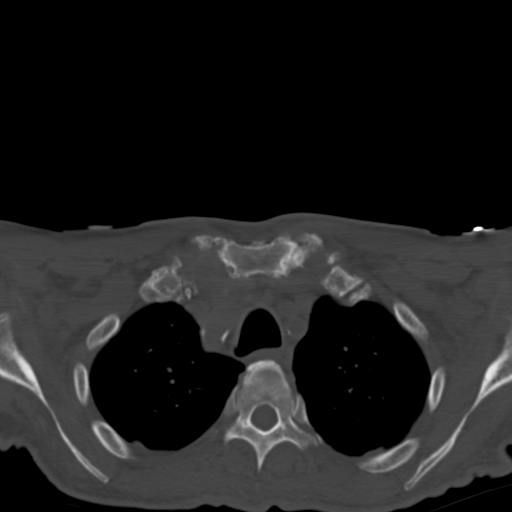
[im 58/116  bone]
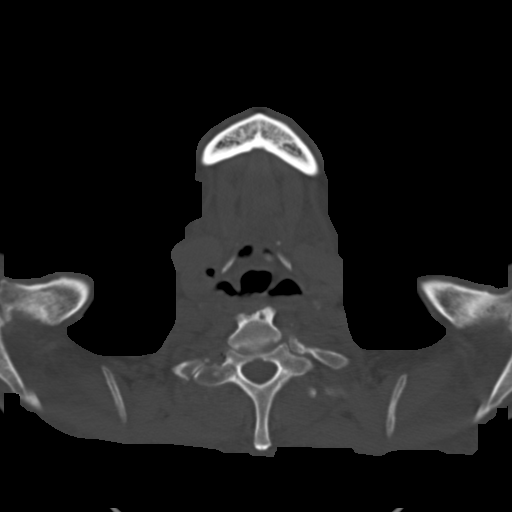
[im 87/116  bone]
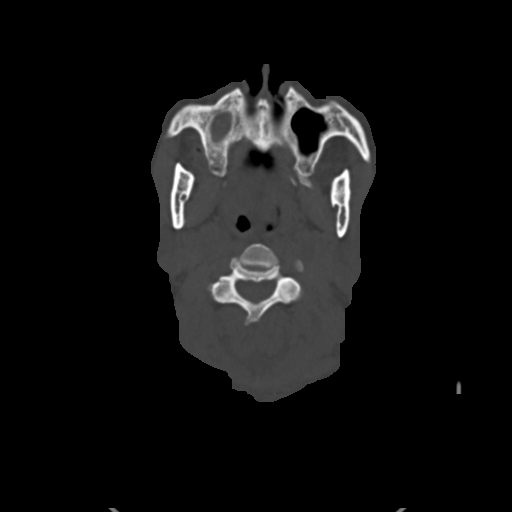

[Series 4: sag neck wo · sagittal · 0.45mm/px · 5 of 70 slices shown, 6 images]
[im 24/70  bone]
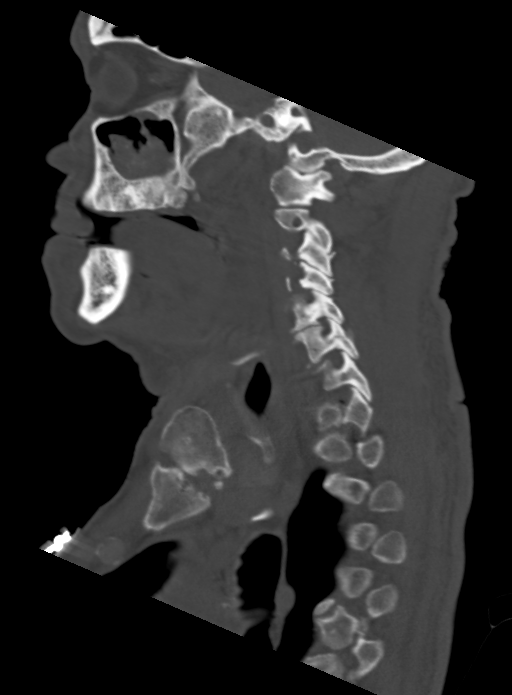
[im 29/70  bone]
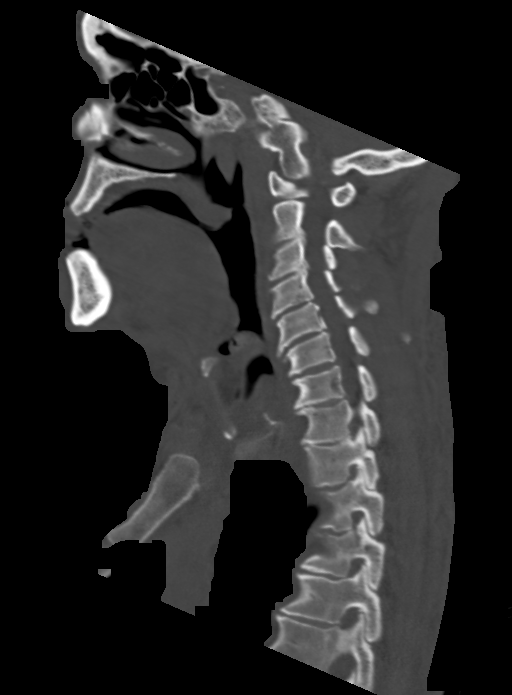
[im 35/70  soft-tissue]
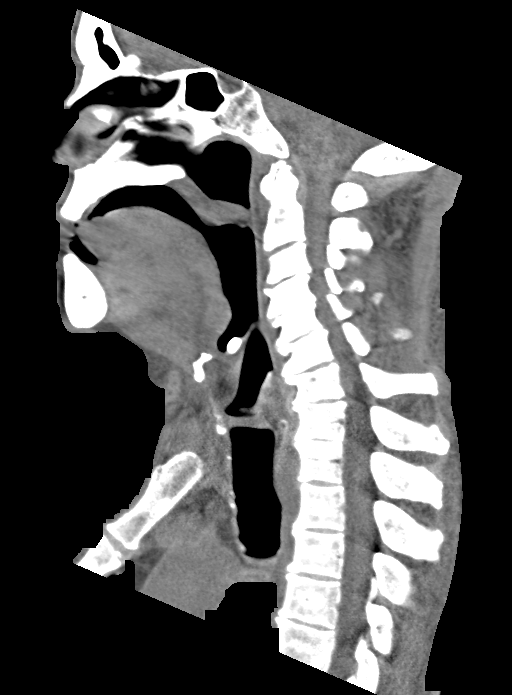
[im 35/70  bone]
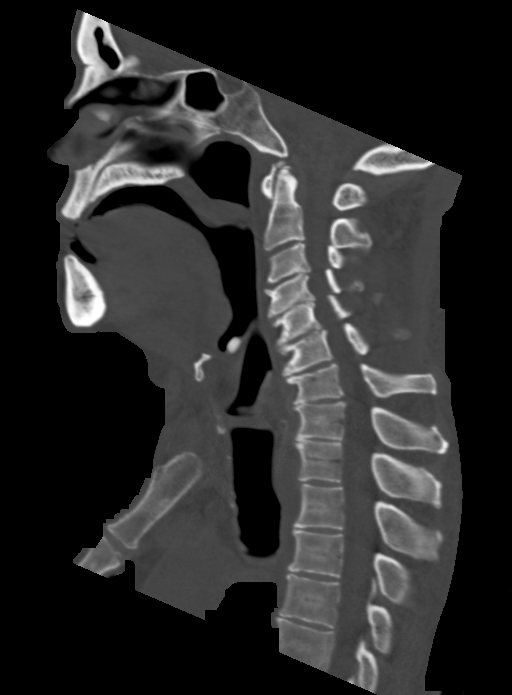
[im 41/70  bone]
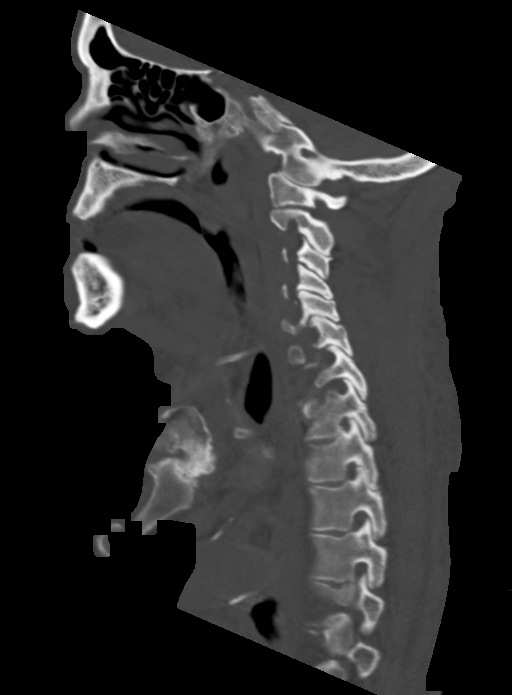
[im 47/70  bone]
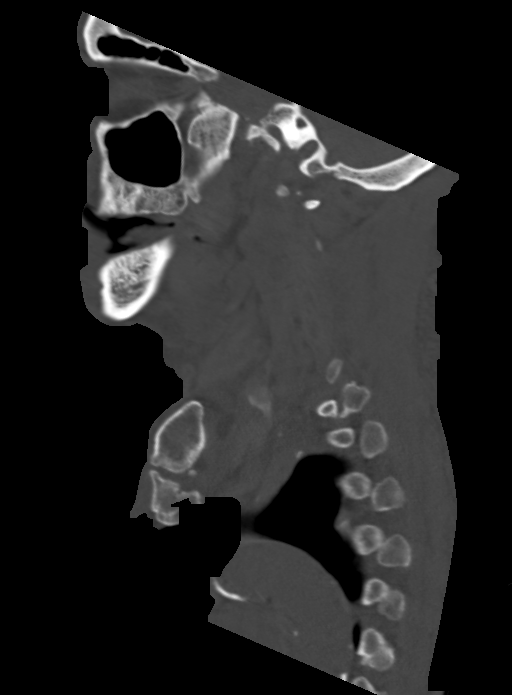

[Series 5: cor neck wo · coronal · 0.55mm/px · 3 of 93 slices shown]
[im 29/93  bone]
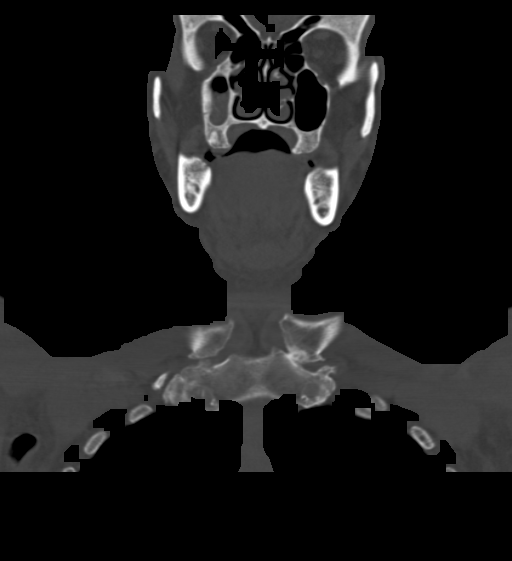
[im 41/93  bone]
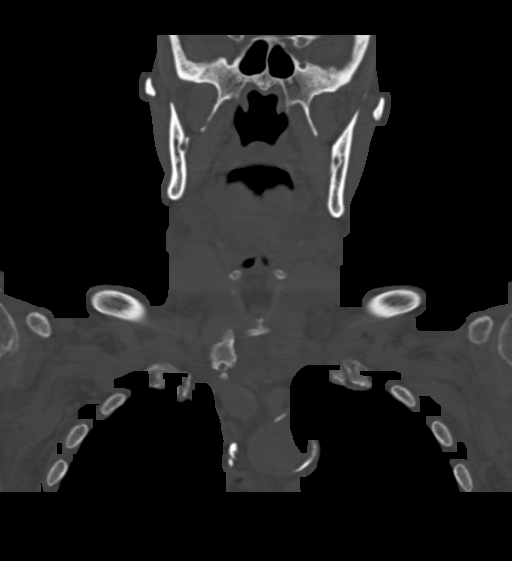
[im 53/93  bone]
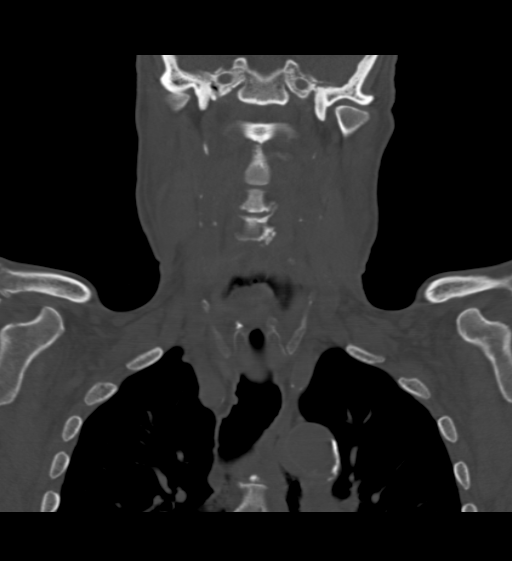

[Series 7: ax oropharynx wo · axial · 0.55mm/px · z∈[-240,-106]mm · 4 of 124 slices shown, 5 images]
[im 25/124  soft-tissue]
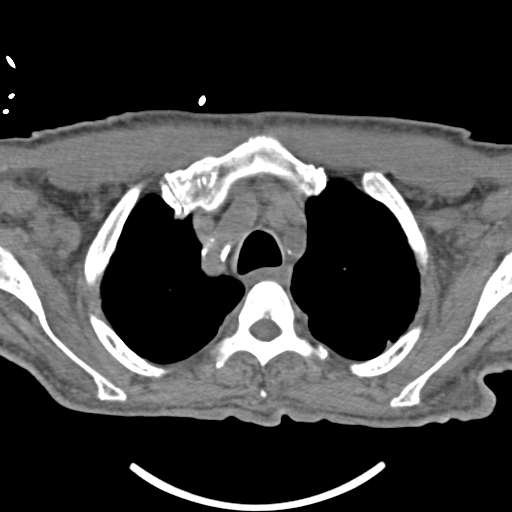
[im 25/124  bone]
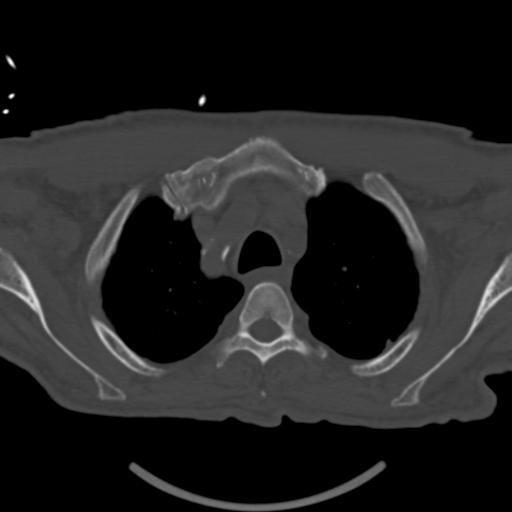
[im 50/124  bone]
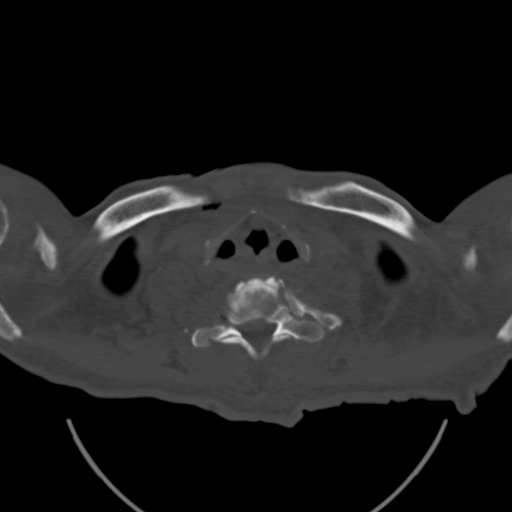
[im 74/124  bone]
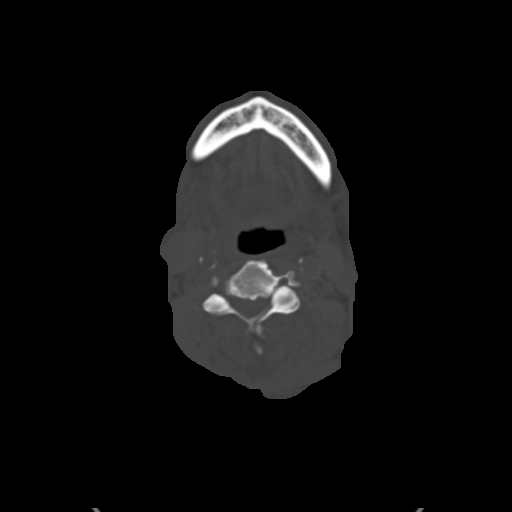
[im 99/124  bone]
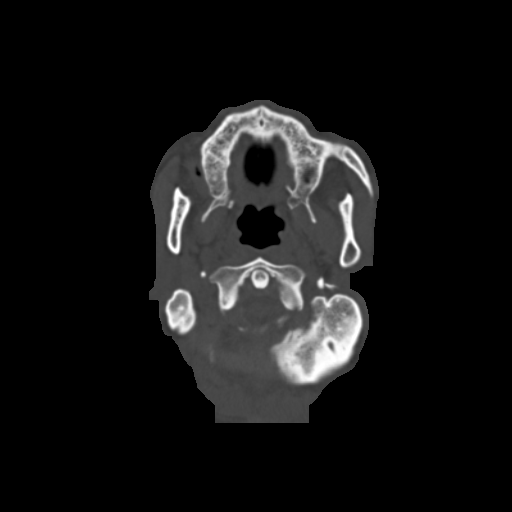

[15 of 33 positions shown; findings below may reference images not displayed]

FINDINGS: Pharynx and larynx: Normal nasopharynx. Radiopaque foreign object at
the level of the left side of the valleculae measures 8 mm on image
56/series 2. Normal larynx.

Salivary glands: Diminutive but symmetric submandibular and parotid
glands.

Thyroid: Within normal limits.

Lymph nodes: No adenopathy. Evaluation limited by lack of IV
contrast and paucity of fat.

Vascular: Bilateral carotid atherosclerosis.

Limited intracranial: Unremarkable

Visualized orbits: Normal in appearance.

Mastoids and visualized paranasal sinuses: Clear mastoid air cells.
Partial opacification of the right maxillary sinus. Hyperostosis
frontalis incidentally noted. Multiple periapical lucencies within
the maxilla.

Skeleton: Cervical spondylosis.

Upper chest: Aortic and branch vessel atherosclerosis. Clear lung
apices. Anasarca.
IMPRESSION: 1. 8 mm radiopaque foreign object positioned within the left
vallecula.
2. Atherosclerosis.
3. Sinus disease.

## 2016-05-29 IMAGING — CR DG ABDOMEN 2V
1 series · 3 of 3 positions shown · non-contrast
Comparison: 10/26/2013

CLINICAL DATA: Difficulty swallowing, history of Parkinson's
disease

EXAM:
ABDOMEN - 2 VIEW

[Series 1: ap · 0.17mm/px · 3 of 3 slices shown]
[im 1/3]
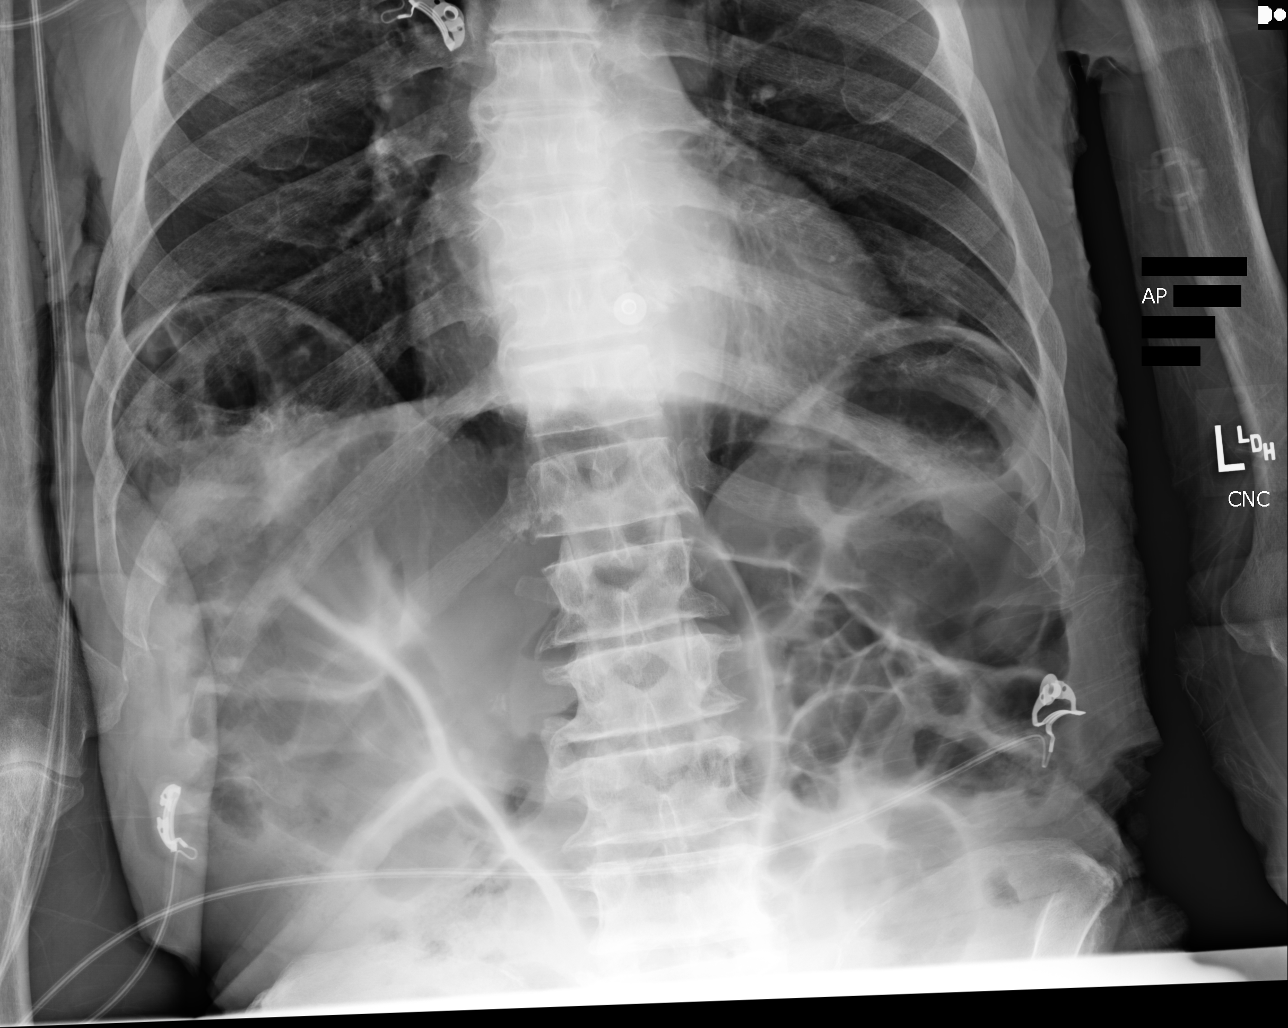
[im 2/3]
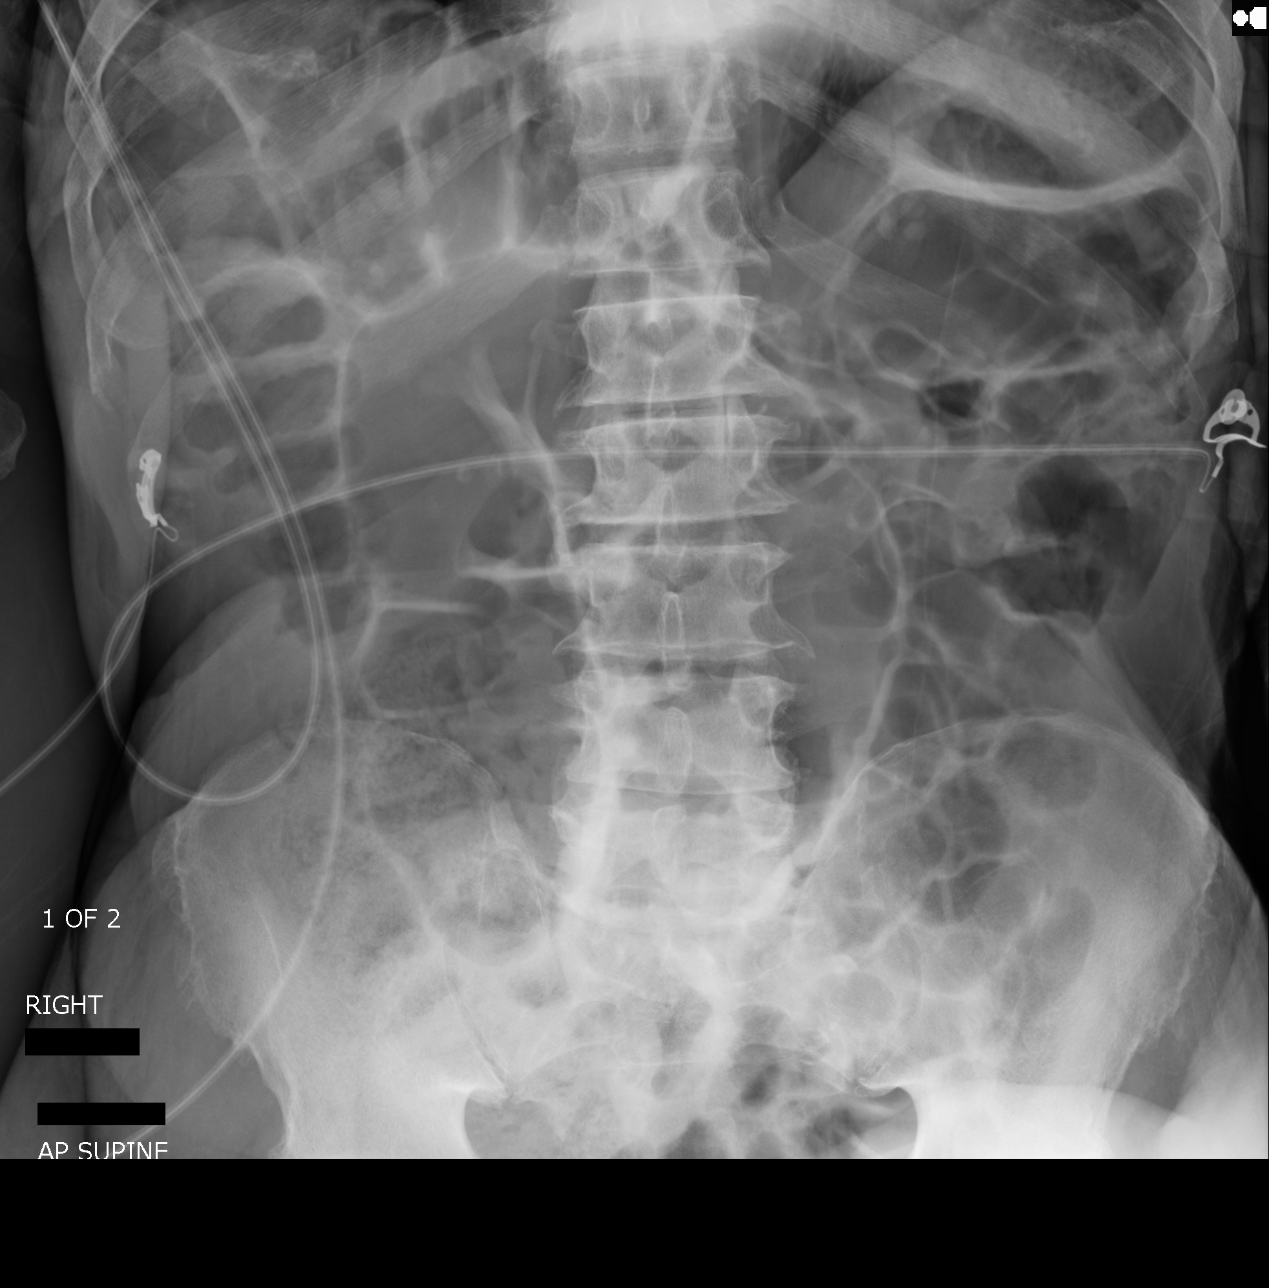
[im 3/3]
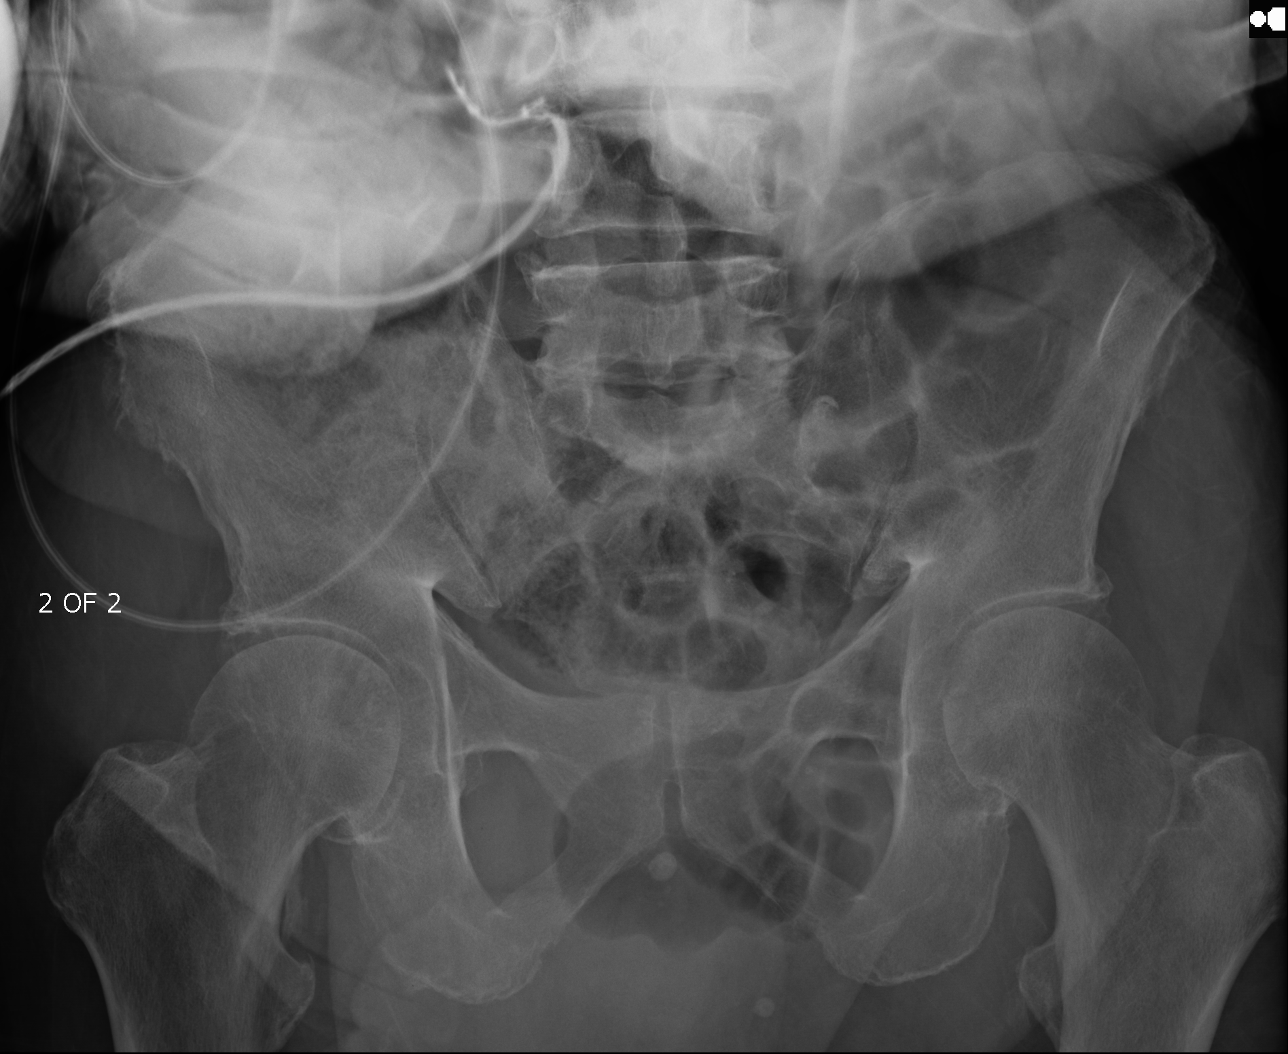

[3 of 3 positions shown; findings below may reference images not displayed]

FINDINGS: Scattered large and small bowel gas is noted. No obstructive changes
are seen. These changes may represent underlying ileus. No free air
is noted. Degenerative changes of the lumbar spine are seen.
IMPRESSION: Changes which may represent a generalized ileus. Correlation with
physical exam is recommended. No obstructive changes are seen. No
free air is noted.
